# Patient Record
Sex: Female | Born: 1990 | Hispanic: No | Marital: Married | Smoking: Never smoker
Health system: Southern US, Community
[De-identification: ages and names within clinical notes are randomized; demographics above are authoritative.]

## PROBLEM LIST (undated history)

## (undated) DIAGNOSIS — G43909 Migraine, unspecified, not intractable, without status migrainosus: Secondary | ICD-10-CM

## (undated) HISTORY — PX: OTHER SURGICAL HISTORY: SHX169

---

## 2009-06-19 ENCOUNTER — Ambulatory Visit (HOSPITAL_COMMUNITY): Admission: RE | Admit: 2009-06-19 | Discharge: 2009-06-19 | Payer: Self-pay | Admitting: Obstetrics and Gynecology

## 2009-08-04 ENCOUNTER — Ambulatory Visit (HOSPITAL_COMMUNITY): Admission: RE | Admit: 2009-08-04 | Discharge: 2009-08-04 | Payer: Self-pay | Admitting: Obstetrics and Gynecology

## 2009-09-11 ENCOUNTER — Ambulatory Visit (HOSPITAL_COMMUNITY): Admission: RE | Admit: 2009-09-11 | Discharge: 2009-09-11 | Payer: Self-pay | Admitting: Obstetrics and Gynecology

## 2013-08-12 ENCOUNTER — Ambulatory Visit (INDEPENDENT_AMBULATORY_CARE_PROVIDER_SITE_OTHER): Payer: BC Managed Care – PPO | Admitting: Family Medicine

## 2013-08-12 ENCOUNTER — Encounter: Payer: Self-pay | Admitting: Family Medicine

## 2013-08-12 ENCOUNTER — Telehealth: Payer: Self-pay | Admitting: Family Medicine

## 2013-08-12 ENCOUNTER — Encounter (INDEPENDENT_AMBULATORY_CARE_PROVIDER_SITE_OTHER): Payer: Self-pay

## 2013-08-12 ENCOUNTER — Encounter: Payer: Self-pay | Admitting: *Deleted

## 2013-08-12 ENCOUNTER — Other Ambulatory Visit: Payer: Self-pay | Admitting: Family Medicine

## 2013-08-12 VITALS — BP 110/66 | HR 88 | Temp 98.1°F | Ht 64.0 in | Wt 150.6 lb

## 2013-08-12 DIAGNOSIS — H9190 Unspecified hearing loss, unspecified ear: Secondary | ICD-10-CM

## 2013-08-12 DIAGNOSIS — F32A Depression, unspecified: Secondary | ICD-10-CM

## 2013-08-12 DIAGNOSIS — F329 Major depressive disorder, single episode, unspecified: Secondary | ICD-10-CM

## 2013-08-12 DIAGNOSIS — R5383 Other fatigue: Secondary | ICD-10-CM

## 2013-08-12 DIAGNOSIS — F3289 Other specified depressive episodes: Secondary | ICD-10-CM

## 2013-08-12 DIAGNOSIS — H6690 Otitis media, unspecified, unspecified ear: Secondary | ICD-10-CM

## 2013-08-12 DIAGNOSIS — G4700 Insomnia, unspecified: Secondary | ICD-10-CM

## 2013-08-12 DIAGNOSIS — R5381 Other malaise: Secondary | ICD-10-CM

## 2013-08-12 LAB — POCT CBC
Granulocyte percent: 66.4 %G (ref 37–80)
HCT, POC: 42.8 % (ref 37.7–47.9)
Hemoglobin: 13.8 g/dL (ref 12.2–16.2)
Lymph, poc: 1.8 (ref 0.6–3.4)
MCH, POC: 29.5 pg (ref 27–31.2)
MCHC: 32.2 g/dL (ref 31.8–35.4)
MCV: 91.7 fL (ref 80–97)
MPV: 8 fL (ref 0–99.8)
POC Granulocyte: 3.8 (ref 2–6.9)
POC LYMPH PERCENT: 30.9 %L (ref 10–50)
Platelet Count, POC: 267 10*3/uL (ref 142–424)
RBC: 4.7 M/uL (ref 4.04–5.48)
RDW, POC: 12.9 %
WBC: 5.7 10*3/uL (ref 4.6–10.2)

## 2013-08-12 MED ORDER — SERTRALINE HCL 50 MG PO TABS: 50.0000 mg | ORAL_TABLET | Freq: Every day | ORAL | Status: AC

## 2013-08-12 MED ORDER — SERTRALINE HCL 50 MG PO TABS
50.0000 mg | ORAL_TABLET | Freq: Every day | ORAL | Status: DC
Start: 2013-08-12 — End: 2013-08-12

## 2013-08-12 MED ORDER — AMOXICILLIN 875 MG PO TABS: 875.0000 mg | ORAL_TABLET | Freq: Two times a day (BID) | ORAL | Status: DC

## 2013-08-12 NOTE — Progress Notes (Signed)
   Subjective:    Patient ID: Amber Byrd, female    DOB: 05/05/1991, 23 y.o.   MRN: [id number]  HPI  This 23 y.o. female presents for evaluation of ear problems.  She has been having some depression And insomnia.  She has been having some hearing problems.  She has hx of ear problems and  Perforations in the past. Review of Systems No chest pain, SOB, HA, dizziness, vision change, N/V, diarrhea, constipation, dysuria, urinary urgency or frequency, myalgias, arthralgias or rash.     Objective:   Physical Exam  Vital signs noted  Well developed well nourished female.  HEENT - Head atraumatic Normocephalic                Eyes - PERRLA, Conjuctiva - clear Sclera- Clear EOMI                Ears - EAC's Wnl TM's injected Gross Hearing WNL                Nose - Nares patent                 Throat - oropharanx wnl Respiratory - Lungs CTA bilateral Cardiac - RRR S1 and S2 without murmur GI - Abdomen soft Nontender and bowel sounds active x 4 Extremities - No edema. Neuro - Grossly intact.      Assessment & Plan:  Depression - Plan: sertraline (ZOLOFT) 50 MG tablet  Other malaise and fatigue - Plan: POCT CBC, CMP14+EGFR, Thyroid Panel With TSH, Vitamin B12, Vit D  25 hydroxy (rtn osteoporosis monitoring), Lipid panel  Otitis media - Plan: POCT CBC, CMP14+EGFR, Thyroid Panel With TSH, Vitamin B12, Vit D  25 hydroxy (rtn osteoporosis monitoring), Lipid panel, Ambulatory referral to ENT, amoxicillin (AMOXIL) 875 MG tablet  Hearing deficit - Plan: POCT CBC, CMP14+EGFR, Thyroid Panel With TSH, Vitamin B12, Vit D  25 hydroxy (rtn osteoporosis monitoring), Lipid panel, Ambulatory referral to ENT, amoxicillin (AMOXIL) 875 MG tablet  Insomnia - Plan: sertraline (ZOLOFT) 50 MG po qd.  List of psychiatric providers given to patient to get checked for possible bipolar disorder.  Discussed sleep hygiene and melatonin otc prn.  Lysbeth Penner FNP

## 2013-08-12 NOTE — Patient Instructions (Signed)
Otitis Media, Adult Otitis media is redness, soreness, and swelling (inflammation) of the middle ear. Otitis media may be caused by allergies or, most commonly, by infection. Often it occurs as a complication of the common cold. SIGNS AND SYMPTOMS Symptoms of otitis media may include:  Earache.  Fever.  Ringing in your ear.  Headache.  Leakage of fluid from the ear. DIAGNOSIS To diagnose otitis media, your health care provider will examine your ear with an otoscope. This is an instrument that allows your health care provider to see into your ear in order to examine your eardrum. Your health care provider also will ask you questions about your symptoms. TREATMENT  Typically, otitis media resolves on its own within 3 5 days. Your health care provider may prescribe medicine to ease your symptoms of pain. If otitis media does not resolve within 5 days or is recurrent, your health care provider may prescribe antibiotic medicines if he or she suspects that a bacterial infection is the cause. HOME CARE INSTRUCTIONS   Take your medicine as directed until it is gone, even if you feel better after the first few days.  Only take over-the-counter or prescription medicines for pain, discomfort, or fever as directed by your health care provider.  Follow up with your health care provider as directed. SEEK MEDICAL CARE IF:  You have otitis media only in one ear or bleeding from your nose or both.  You notice a lump on your neck.  You are not getting better in 3 5 days.  You feel worse instead of better. SEEK IMMEDIATE MEDICAL CARE IF:   You have pain that is not controlled with medicine.  You have swelling, redness, or pain around your ear or stiffness in your neck.  You notice that part of your face is paralyzed.  You notice that the bone behind your ear (mastoid) is tender when you touch it. MAKE SURE YOU:   Understand these instructions.  Will watch your condition.  Will get help  right away if you are not doing well or get worse. Document Released: 03/04/2004 Document Revised: 03/20/2013 Document Reviewed: 12/25/2012 Exit[employer] Patient Information 2014 [employer].  

## 2013-08-12 NOTE — Telephone Encounter (Signed)
Patient aware.

## 2013-08-13 ENCOUNTER — Other Ambulatory Visit: Payer: Self-pay | Admitting: Family Medicine

## 2013-08-13 LAB — CMP14+EGFR
ALT: 31 IU/L (ref 0–32)
AST: 27 IU/L (ref 0–40)
Albumin/Globulin Ratio: 1.6 (ref 1.1–2.5)
Albumin: 4.6 g/dL (ref 3.5–5.5)
Alkaline Phosphatase: 71 IU/L (ref 39–117)
BUN/Creatinine Ratio: 12 (ref 8–20)
BUN: 8 mg/dL (ref 6–20)
CO2: 23 mmol/L (ref 18–29)
Calcium: 10 mg/dL (ref 8.7–10.2)
Chloride: 100 mmol/L (ref 97–108)
Creatinine, Ser: 0.67 mg/dL (ref 0.57–1.00)
GFR calc Af Amer: 142 mL/min/{1.73_m2} (ref >59)
GFR calc non Af Amer: 123 mL/min/{1.73_m2} (ref >59)
Globulin, Total: 2.8 g/dL (ref 1.5–4.5)
Glucose: 93 mg/dL (ref 65–99)
Potassium: 4.8 mmol/L (ref 3.5–5.2)
Sodium: 140 mmol/L (ref 134–144)
Total Bilirubin: 0.4 mg/dL (ref 0.0–1.2)
Total Protein: 7.4 g/dL (ref 6.0–8.5)

## 2013-08-13 LAB — THYROID PANEL WITH TSH
Free Thyroxine Index: 2.2 (ref 1.2–4.9)
T3 Uptake Ratio: 28 % (ref 24–39)
T4, Total: 8 ug/dL (ref 4.5–12.0)
TSH: 3.2 u[IU]/mL (ref 0.450–4.500)

## 2013-08-13 LAB — VITAMIN D 25 HYDROXY (VIT D DEFICIENCY, FRACTURES): Vit D, 25-Hydroxy: 26.1 ng/mL — ABNORMAL LOW (ref 30.0–100.0)

## 2013-08-13 LAB — LIPID PANEL
Chol/HDL Ratio: 3.4 ratio units (ref 0.0–4.4)
Cholesterol, Total: 148 mg/dL (ref 100–189)
HDL: 43 mg/dL (ref >39)
LDL Calculated: 92 mg/dL (ref 0–119)
Triglycerides: 66 mg/dL (ref 0–114)
VLDL Cholesterol Cal: 13 mg/dL (ref 5–40)

## 2013-08-13 LAB — VITAMIN B12: Vitamin B-12: 379 pg/mL (ref 211–946)

## 2013-08-13 MED ORDER — VITAMIN D (ERGOCALCIFEROL) 1.25 MG (50000 UNIT) PO CAPS: 50000.0000 [IU] | ORAL_CAPSULE | ORAL | Status: AC

## 2013-09-05 ENCOUNTER — Encounter: Payer: Self-pay | Admitting: *Deleted

## 2014-07-09 ENCOUNTER — Other Ambulatory Visit: Payer: Self-pay | Admitting: Obstetrics & Gynecology

## 2014-07-09 DIAGNOSIS — O3680X0 Pregnancy with inconclusive fetal viability, not applicable or unspecified: Secondary | ICD-10-CM

## 2014-07-11 ENCOUNTER — Emergency Department (HOSPITAL_COMMUNITY): Payer: Medicaid Other

## 2014-07-11 ENCOUNTER — Ambulatory Visit: Payer: Self-pay

## 2014-07-11 ENCOUNTER — Encounter (HOSPITAL_COMMUNITY): Payer: Self-pay

## 2014-07-11 ENCOUNTER — Emergency Department (HOSPITAL_COMMUNITY)
Admission: EM | Admit: 2014-07-11 | Discharge: 2014-07-11 | Disposition: A | Discharge status: Home / Self Care | Payer: Medicaid Other | Attending: Emergency Medicine | Admitting: Emergency Medicine

## 2014-07-11 DIAGNOSIS — O209 Hemorrhage in early pregnancy, unspecified: Secondary | ICD-10-CM | POA: Insufficient documentation

## 2014-07-11 DIAGNOSIS — Z79899 Other long term (current) drug therapy: Secondary | ICD-10-CM | POA: Insufficient documentation

## 2014-07-11 DIAGNOSIS — Z3A01 Less than 8 weeks gestation of pregnancy: Secondary | ICD-10-CM | POA: Diagnosis not present

## 2014-07-11 DIAGNOSIS — Z792 Long term (current) use of antibiotics: Secondary | ICD-10-CM | POA: Diagnosis not present

## 2014-07-11 DIAGNOSIS — O00109 Unspecified tubal pregnancy without intrauterine pregnancy: Secondary | ICD-10-CM

## 2014-07-11 LAB — CBC WITH DIFFERENTIAL/PLATELET
Basophils Absolute: 0.1 10*3/uL (ref 0.0–0.1)
Basophils Relative: 1 % (ref 0–1)
EOS PCT: 6 % — AB (ref 0–5)
Eosinophils Absolute: 0.4 10*3/uL (ref 0.0–0.7)
HEMATOCRIT: 36.8 % (ref 36.0–46.0)
HEMOGLOBIN: 12.7 g/dL (ref 12.0–15.0)
LYMPHS ABS: 1.4 10*3/uL (ref 0.7–4.0)
LYMPHS PCT: 24 % (ref 12–46)
MCH: 31.3 pg (ref 26.0–34.0)
MCHC: 34.5 g/dL (ref 30.0–36.0)
MCV: 90.6 fL (ref 78.0–100.0)
MONOS PCT: 5 % (ref 3–12)
Monocytes Absolute: 0.3 10*3/uL (ref 0.1–1.0)
NEUTROS ABS: 3.8 10*3/uL (ref 1.7–7.7)
Neutrophils Relative %: 64 % (ref 43–77)
PLATELETS: 222 10*3/uL (ref 150–400)
RBC: 4.06 MIL/uL (ref 3.87–5.11)
RDW: 13.1 % (ref 11.5–15.5)
WBC: 5.9 10*3/uL (ref 4.0–10.5)

## 2014-07-11 LAB — URINALYSIS, ROUTINE W REFLEX MICROSCOPIC
Bilirubin Urine: NEGATIVE
Glucose, UA: NEGATIVE mg/dL
HGB URINE DIPSTICK: NEGATIVE
KETONES UR: NEGATIVE mg/dL
Leukocytes, UA: NEGATIVE
Nitrite: NEGATIVE
PH: 6 (ref 5.0–8.0)
Protein, ur: NEGATIVE mg/dL
Specific Gravity, Urine: >1.03 — ABNORMAL HIGH (ref 1.005–1.030)
UROBILINOGEN UA: 0.2 mg/dL (ref 0.0–1.0)

## 2014-07-11 LAB — HEPATIC FUNCTION PANEL
ALBUMIN: 3.9 g/dL (ref 3.5–5.2)
ALT: 14 U/L (ref 0–35)
AST: 15 U/L (ref 0–37)
Alkaline Phosphatase: 55 U/L (ref 39–117)
BILIRUBIN INDIRECT: 0.4 mg/dL (ref 0.3–0.9)
Bilirubin, Direct: 0.1 mg/dL (ref 0.0–0.5)
Total Bilirubin: 0.5 mg/dL (ref 0.3–1.2)
Total Protein: 7.1 g/dL (ref 6.0–8.3)

## 2014-07-11 LAB — ABO/RH: ABO/RH(D): A NEG

## 2014-07-11 LAB — WET PREP, GENITAL
Trich, Wet Prep: NONE SEEN
WBC WET PREP: NONE SEEN
YEAST WET PREP: NONE SEEN

## 2014-07-11 LAB — POC URINE PREG, ED: PREG TEST UR: POSITIVE — AB

## 2014-07-11 LAB — HCG, QUANTITATIVE, PREGNANCY: hCG, Beta Chain, Quant, S: 11942 m[IU]/mL — ABNORMAL HIGH (ref <5)

## 2014-07-11 MED ORDER — RHO D IMMUNE GLOBULIN 1500 UNIT/2ML IJ SOSY
300.0000 ug | PREFILLED_SYRINGE | Freq: Once | INTRAMUSCULAR | Status: AC
  Administered 2014-07-11: 300 ug via INTRAMUSCULAR

## 2014-07-11 MED ORDER — METHOTREXATE INJECTION FOR WOMEN'S HOSPITAL
64.0000 mg | Freq: Once | INTRAMUSCULAR | Status: DC
  Filled 2014-07-11: qty 1.3

## 2014-07-11 MED ORDER — METHOTREXATE INJECTION FOR WOMEN'S HOSPITAL
50.0000 mg/m2 | Freq: Once | INTRAMUSCULAR | Status: AC
  Administered 2014-07-11: 85 mg via INTRAMUSCULAR
  Filled 2014-07-11: qty 1.7

## 2014-07-11 NOTE — ED Provider Notes (Signed)
CSN: [id number]     Arrival date & time 07/11/14  4098 History   First MD Initiated Contact with Patient 07/11/14 [phone]     Chief Complaint  Patient presents with  . Vaginal Bleeding     (Consider location/radiation/quality/duration/timing/severity/associated sxs/prior Treatment) Patient is a 24 y.o. female presenting with vaginal bleeding. The history is provided by the patient.  Vaginal Bleeding Quality:  Bright red Severity:  Moderate Onset quality:  Sudden Timing:  Intermittent Progression:  Worsening Chronicity:  New Menstrual history:  Irregular Possible pregnancy: yes   Relieved by:  None tried Worsened by:  Nothing tried Ineffective treatments:  None tried Associated symptoms: no dysuria and no vaginal discharge    Amber Byrd is a 24 y.o. G3P2 who presents to the ED with vaginal bleeding in early pregnancy. She states that she went to [facility] Jan. 12 for an ear infection and they told her at that visit that she was pregnant. She states that her periods are irregular since removal of her IUD almost a year ago and she did not know she was pregnant. She uses no birth control. The bleeding started one week ago as spotting and has gotten heavier over the past 2 days using about 4 pads per day. She denies abdominal cramping.  Patient does desire to be pregnant.   History reviewed. No pertinent past medical history. Past Surgical History  Procedure Laterality Date  . 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 Diabetes Mother   . Heart disease Mother   . Depression Father   . Arthritis Father    History  Substance Use Topics  . Smoking status: Never Smoker   . Smokeless tobacco: Not on file  . Alcohol Use: No   OB History    No data available     Review of Systems  Genitourinary: Positive for vaginal bleeding. Negative for dysuria, vaginal discharge and pelvic pain.       Pregnant  all other systems  negative    Allergies  Review of patient's allergies indicates no known allergies.  Home Medications   Prior to Admission medications   Medication Sig Start Date End Date Taking? Authorizing Provider  amoxicillin (AMOXIL) 875 MG tablet Take 1 tablet (875 mg total) by mouth 2 (two) times daily. Patient not taking: Reported on 07/11/2014 08/12/13   Deatra Canter, FNP  sertraline (ZOLOFT) 50 MG tablet Take 1 tablet (50 mg total) by mouth daily. Patient not taking: Reported on 07/11/2014 08/12/13   Deatra Canter, FNP  Vitamin D, Ergocalciferol, (DRISDOL) 50000 UNITS CAPS capsule Take 1 capsule (50,000 Units total) by mouth every 7 (seven) days. Patient not taking: Reported on 07/11/2014 08/13/13   Deatra Canter, FNP   BP 123/67 mmHg  Pulse 62  Temp(Src) 98 F (36.7 C) (Oral)  Resp 18  Ht 5\' 3"  (1.6 m)  Wt 140 lb (63.504 kg)  BMI 24.81 kg/m2  SpO2 100%  LMP 04/29/2014 Physical Exam  Constitutional: She is oriented to person, place, and time. She appears well-developed and well-nourished.  HENT:  Head: Normocephalic.  Eyes: Conjunctivae and EOM are normal.  Neck: Neck supple.  Cardiovascular: Normal rate and regular rhythm.   Pulmonary/Chest: Effort normal. She has no wheezes. She has no rales.  Abdominal: Soft. There is no tenderness.  Genitourinary:  External genitalia without lesions, small blood vaginal vault. Cervix closed, inflamed, no CMT, no adnexal  tenderness or mass palpated. Uterus slightly enlarged.   Musculoskeletal: Normal range of motion.  Neurological: She is alert and oriented to person, place, and time. No cranial nerve deficit.  Skin: Skin is warm and dry.  Psychiatric: She has a normal mood and affect. Her behavior is normal.  Nursing note and vitals reviewed.   ED Course  Procedures (including critical care time) Labs, ultrasound Labs Review Results for orders placed or performed during the hospital encounter of 07/11/14 (from the past 24 hour(s))   Urinalysis, Routine w reflex microscopic     Status: Abnormal   Collection Time: 07/11/14  9:43 AM  Result Value Ref Range   Color, Urine YELLOW YELLOW   APPearance CLEAR CLEAR   Specific Gravity, Urine >1.030 (H) 1.005 - 1.030   pH 6.0 5.0 - 8.0   Glucose, UA NEGATIVE NEGATIVE mg/dL   Hgb urine dipstick NEGATIVE NEGATIVE   Bilirubin Urine NEGATIVE NEGATIVE   Ketones, ur NEGATIVE NEGATIVE mg/dL   Protein, ur NEGATIVE NEGATIVE mg/dL   Urobilinogen, UA 0.2 0.0 - 1.0 mg/dL   Nitrite NEGATIVE NEGATIVE   Leukocytes, UA NEGATIVE NEGATIVE  Hepatic function panel     Status: None   Collection Time: 07/11/14  9:51 AM  Result Value Ref Range   Total Protein 7.1 6.0 - 8.3 g/dL   Albumin 3.9 3.5 - 5.2 g/dL   AST 15 0 - 37 U/L   ALT 14 0 - 35 U/L   Alkaline Phosphatase 55 39 - 117 U/L   Total Bilirubin 0.5 0.3 - 1.2 mg/dL   Bilirubin, Direct 0.1 0.0 - 0.5 mg/dL   Indirect Bilirubin 0.4 0.3 - 0.9 mg/dL  POC urine preg, ED (not at [facility])     Status: Abnormal   Collection Time: 07/11/14  9:56 AM  Result Value Ref Range   Preg Test, Ur POSITIVE (A) NEGATIVE  hCG, quantitative, pregnancy     Status: Abnormal   Collection Time: 07/11/14  9:59 AM  Result Value Ref Range   hCG, Beta Chain, Quant, S 11942 (H) <5 mIU/mL  CBC with Differential/Platelet     Status: Abnormal   Collection Time: 07/11/14  9:59 AM  Result Value Ref Range   WBC 5.9 4.0 - 10.5 K/uL   RBC 4.06 3.87 - 5.11 MIL/uL   Hemoglobin 12.7 12.0 - 15.0 g/dL   HCT 16.1 09.6 - 04.5 %   MCV 90.6 78.0 - 100.0 fL   MCH 31.3 26.0 - 34.0 pg   MCHC 34.5 30.0 - 36.0 g/dL   RDW 40.9 81.1 - 91.4 %   Platelets 222 150 - 400 K/uL   Neutrophils Relative % 64 43 - 77 %   Neutro Abs 3.8 1.7 - 7.7 K/uL   Lymphocytes Relative 24 12 - 46 %   Lymphs Abs 1.4 0.7 - 4.0 K/uL   Monocytes Relative 5 3 - 12 %   Monocytes Absolute 0.3 0.1 - 1.0 K/uL   Eosinophils Relative 6 (H) 0 - 5 %   Eosinophils Absolute 0.4 0.0 - 0.7 K/uL   Basophils Relative  1 0 - 1 %   Basophils Absolute 0.1 0.0 - 0.1 K/uL  ABO/Rh     Status: None   Collection Time: 07/11/14  9:59 AM  Result Value Ref Range   ABO/RH(D) A NEG   Wet prep, genital     Status: Abnormal   Collection Time: 07/11/14 10:15 AM  Result Value Ref Range   Yeast Wet Prep HPF POC  NONE SEEN NONE SEEN   Trich, Wet Prep NONE SEEN NONE SEEN   Clue Cells Wet Prep HPF POC MANY (A) NONE SEEN   WBC, Wet Prep HPF POC NONE SEEN NONE SEEN    Imaging Review [place] Ob Comp Less 14 Wks  07/11/2014   CLINICAL DATA:  Vaginal bleeding, early pregnancy, quantitative beta HCG 11,942  EXAM: OBSTETRIC <14 WK US AND TRANSVAGINAL OB US  TECHNIQUE: Both transabdominal and transvaginal ultrasound examinations were performed for complete evaluation of the gestation as well as the maternal uterus, adnexal regions, and pelvic cul-de-sac. Transvaginal technique was performed to assess early pregnancy.  COMPARISON:  None for this pregnancy  FINDINGS: Intrauterine gestational sac: Not identified  Yolk sac:  N/A  Embryo:  N/A  Cardiac Activity: N/A  Heart Rate:  N/A bpm  Maternal uterus/adnexae:  Minimal amount of linear nonspecific hypoechoic material within the endometrial canal likely minimal fluid.  Nabothian cyst at cervix.  No discrete gestational sac identified.  LEFT ovary normal size and morphology 2.9 x 1.4 x 2.9 cm.  RIGHT ovary normal size and morphology, 3.0 x 2.3 x 3.1 cm.  Immediately adjacent to the RIGHT ovary is a heterogeneous rounded nodule measuring 2.1 x 1.7 x 2.2 cm in size.  Lesion shows a small central hypoechoic focus question fluid.  Peripheral hypervascularity within lesion on color Doppler imaging.  Finding is consistent with an ectopic pregnancy adjacent to the RIGHT ovary.  No additional pelvic masses or free pelvic fluid.  Prominent parametrial vessels incidentally noted.  IMPRESSION: No intrauterine gestational identified.  RIGHT adnexal ectopic pregnancy adjacent to the RIGHT ovary 2.1 x 1.7 x 2.2 cm  in size.  Critical Value/emergent results were called by telephone at the time of interpretation on 07/11/2014 at 12:13 pm to Dr. Hyacinth MeekerMiller who verbally acknowledged these results.   Electronically Signed   By: Ulyses SouthwardMark  Boles M.D.   On: 07/11/2014 12:14   [place] Ob Transvaginal  07/11/2014   CLINICAL DATA:  Vaginal bleeding, early pregnancy, quantitative beta HCG 11,942  EXAM: OBSTETRIC <14 WK US AND TRANSVAGINAL OB US  TECHNIQUE: Both transabdominal and transvaginal ultrasound examinations were performed for complete evaluation of the gestation as well as the maternal uterus, adnexal regions, and pelvic cul-de-sac. Transvaginal technique was performed to assess early pregnancy.  COMPARISON:  None for this pregnancy  FINDINGS: Intrauterine gestational sac: Not identified  Yolk sac:  N/A  Embryo:  N/A  Cardiac Activity: N/A  Heart Rate:  N/A bpm  Maternal uterus/adnexae:  Minimal amount of linear nonspecific hypoechoic material within the endometrial canal likely minimal fluid.  Nabothian cyst at cervix.  No discrete gestational sac identified.  LEFT ovary normal size and morphology 2.9 x 1.4 x 2.9 cm.  RIGHT ovary normal size and morphology, 3.0 x 2.3 x 3.1 cm.  Immediately adjacent to the RIGHT ovary is a heterogeneous rounded nodule measuring 2.1 x 1.7 x 2.2 cm in size.  Lesion shows a small central hypoechoic focus question fluid.  Peripheral hypervascularity within lesion on color Doppler imaging.  Finding is consistent with an ectopic pregnancy adjacent to the RIGHT ovary.  No additional pelvic masses or free pelvic fluid.  Prominent parametrial vessels incidentally noted.  IMPRESSION: No intrauterine gestational identified.  RIGHT adnexal ectopic pregnancy adjacent to the RIGHT ovary 2.1 x 1.7 x 2.2 cm in size.  Critical Value/emergent results were called by telephone at the time of interpretation on 07/11/2014 at 12:13 pm to Dr. Hyacinth MeekerMiller who verbally acknowledged these  results.   Electronically Signed   By: Ulyses Southward  M.D.   On: 07/11/2014 12:14   US Ob Comp Less 14 Wks  07/11/2014   CLINICAL DATA:  Vaginal bleeding, early pregnancy, quantitative beta HCG 11,942  EXAM: OBSTETRIC <14 WK [place] AND TRANSVAGINAL OB US  TECHNIQUE: Both transabdominal and transvaginal ultrasound examinations were performed for complete evaluation of the gestation as well as the maternal uterus, adnexal regions, and pelvic cul-de-sac. Transvaginal technique was performed to assess early pregnancy.  COMPARISON:  None for this pregnancy  FINDINGS: Intrauterine gestational sac: Not identified  Yolk sac:  N/A  Embryo:  N/A  Cardiac Activity: N/A  Heart Rate:  N/A bpm  Maternal uterus/adnexae:  Minimal amount of linear nonspecific hypoechoic material within the endometrial canal likely minimal fluid.  Nabothian cyst at cervix.  No discrete gestational sac identified.  LEFT ovary normal size and morphology 2.9 x 1.4 x 2.9 cm.  RIGHT ovary normal size and morphology, 3.0 x 2.3 x 3.1 cm.  Immediately adjacent to the RIGHT ovary is a heterogeneous rounded nodule measuring 2.1 x 1.7 x 2.2 cm in size.  Lesion shows a small central hypoechoic focus question fluid.  Peripheral hypervascularity within lesion on color Doppler imaging.  Finding is consistent with an ectopic pregnancy adjacent to the RIGHT ovary.  No additional pelvic masses or free pelvic fluid.  Prominent parametrial vessels incidentally noted.  IMPRESSION: No intrauterine gestational identified.  RIGHT adnexal ectopic pregnancy adjacent to the RIGHT ovary 2.1 x 1.7 x 2.2 cm in size.  Critical Value/emergent results were called by telephone at the time of interpretation on 07/11/2014 at 12:13 pm to Dr. Hyacinth Meeker who verbally acknowledged these results.   Electronically Signed   By: Ulyses Southward M.D.   On: 07/11/2014 12:14   US Ob Transvaginal  07/11/2014   CLINICAL DATA:  Vaginal bleeding, early pregnancy, quantitative beta HCG 11,942  EXAM: OBSTETRIC <14 WK [place] AND TRANSVAGINAL OB US  TECHNIQUE: Both  transabdominal and transvaginal ultrasound examinations were performed for complete evaluation of the gestation as well as the maternal uterus, adnexal regions, and pelvic cul-de-sac. Transvaginal technique was performed to assess early pregnancy.  COMPARISON:  None for this pregnancy  FINDINGS: Intrauterine gestational sac: Not identified  Yolk sac:  N/A  Embryo:  N/A  Cardiac Activity: N/A  Heart Rate:  N/A bpm  Maternal uterus/adnexae:  Minimal amount of linear nonspecific hypoechoic material within the endometrial canal likely minimal fluid.  Nabothian cyst at cervix.  No discrete gestational sac identified.  LEFT ovary normal size and morphology 2.9 x 1.4 x 2.9 cm.  RIGHT ovary normal size and morphology, 3.0 x 2.3 x 3.1 cm.  Immediately adjacent to the RIGHT ovary is a heterogeneous rounded nodule measuring 2.1 x 1.7 x 2.2 cm in size.  Lesion shows a small central hypoechoic focus question fluid.  Peripheral hypervascularity within lesion on color Doppler imaging.  Finding is consistent with an ectopic pregnancy adjacent to the RIGHT ovary.  No additional pelvic masses or free pelvic fluid.  Prominent parametrial vessels incidentally noted.  IMPRESSION: No intrauterine gestational identified.  RIGHT adnexal ectopic pregnancy adjacent to the RIGHT ovary 2.1 x 1.7 x 2.2 cm in size.  Critical Value/emergent results were called by telephone at the time of interpretation on 07/11/2014 at 12:13 pm to Dr. Hyacinth Meeker who verbally acknowledged these results.   Electronically Signed   By: Ulyses Southward M.D.   On: 07/11/2014 12:14   Consult with  Dr. Despina Hidden, will give MTX for ectopic pregnancy and she will follow up in the office on Tuesday 2/2/ to repeat the Bhcg. Strict ectopic precautions. No sex, no tampons, nothing in the vagina until ectopic resolves. Patient to go to [facility] for severe pain, heavy bleeding or other problems.   MDM  24 y.o. G3P2 with bleeding in early pregnancy. Evaluated by me an Dr. Hyacinth Meeker. Treated  for ectopic pregnancy with MTX, Rhogam given due to patient blood type A negative. I have reviewed this patient's vital signs, nurses notes, appropriate labs and imaging.  I have discussed findings and plan of care in detail with the patient and she voices understanding and agrees with plan.   Final diagnoses:  Prior ectopic pregnancy in first trimester, antepartum      [facility], NP 07/12/14 [id number]  Vida Roller, MD 07/12/14 [phone]

## 2014-07-11 NOTE — ED Notes (Signed)
Pt reports lmp was November  17 and had a positive pregnancy test Jan 12.  [facility] and was told she was pregnant.  Pt says her periods have been irregular since merena had been removed last March.   Pt reports has had vaginal bleeding x 1 week.  Says doesn't have insurance so she is having a hard time getting  in to see any obgyn.  Denies pain except for cramping after eating.  Denies n/v.  Reports bleeding has been as heavy as her usual periods.

## 2014-07-11 NOTE — ED Notes (Signed)
Blood bank called for rhogam medication and they will have to further testing. Blood bank to call when medication available.

## 2014-07-11 NOTE — ED Provider Notes (Addendum)
This chart was scribed for No att. providers found by Gwenyth Oberatherine Macek, ED Scribe. This patient was seen in room APA01/APA01 and the patient's care was started at 12:14 PM.   HPI Comments: 24 y/o G3P2 . pregnant female who presents to the Emergency Department complaining of gradually worsening vaginal spotting that started 1 week ago. Pt states pain after eating as an associated symptom. She does not know how far along she is, but states her LNMP was 04/2014. Pt reports that she was on birth control until 08/2013. This is pt's 3rd pregnancy. Both prior pregnancies were born to term via vaginal delivery. Pt denies any problems with prior pregnancies. She also denies a history of ovarian or uterine issues. Pt reports that she had an abnormal pap smear last year, but did not follow-up because she lacks insurance. She denies current pain.  Physical Exam: Exam benign which included HR, lungs and abdomen which is non-tender. Reference Nurse Practioner's exam for pelvic exam.  Coordination of Care 12:17 PM Informed pt of results. Pending Gyn consult  Discussed with OB/GYN, Dr. Despina HiddenEure, recommends intramuscular methotrexate and follow-up on Tuesday. Patient informed of plan, stable. For discharge, no abdominal tenderness, no hypotension, no tachycardia, no evidence of hemorrhage on ultrasound.  Medical screening examination/treatment/procedure(s) were conducted as a shared visit with non-physician practitioner(s) and myself.  I personally evaluated the patient during the encounter.  Clinical Impression:   Final diagnoses:  Vaginal bleeding before [redacted] weeks gestation         I personally performed the services described in this documentation, which was scribed in my presence. The recorded information has been reviewed and is accurate.    Vida RollerBrian D Melodie Ashworth, MD 07/11/14 1228  Vida RollerBrian D Lavra Imler, MD 07/11/14 1239

## 2014-07-11 NOTE — ED Notes (Signed)
Pharm[facility] called [facility].

## 2014-07-12 LAB — RH IG WORKUP (INCLUDES ABO/RH)
ABO/RH(D): A NEG
Antibody Screen: NEGATIVE
Gestational Age(Wks): 6
Unit division: 0

## 2014-07-14 LAB — GC/CHLAMYDIA PROBE AMP (~~LOC~~) NOT AT ARMC
Chlamydia: NEGATIVE
NEISSERIA GONORRHEA: NEGATIVE

## 2014-07-14 LAB — HIV ANTIBODY (ROUTINE TESTING W REFLEX): HIV Screen 4th Generation wRfx: NONREACTIVE

## 2014-07-14 LAB — RPR: RPR Ser Ql: NONREACTIVE

## 2014-07-15 ENCOUNTER — Other Ambulatory Visit: Payer: Self-pay

## 2014-07-16 ENCOUNTER — Ambulatory Visit: Payer: Self-pay | Admitting: Obstetrics and Gynecology

## 2014-10-06 ENCOUNTER — Encounter (HOSPITAL_COMMUNITY): Payer: Self-pay | Admitting: Emergency Medicine

## 2014-10-06 ENCOUNTER — Emergency Department (HOSPITAL_COMMUNITY): Payer: Medicaid Other

## 2014-10-06 ENCOUNTER — Emergency Department (HOSPITAL_COMMUNITY)
Admission: EM | Admit: 2014-10-06 | Discharge: 2014-10-06 | Disposition: A | Discharge status: Home / Self Care | Payer: Medicaid Other | Attending: Emergency Medicine | Admitting: Emergency Medicine

## 2014-10-06 DIAGNOSIS — J309 Allergic rhinitis, unspecified: Secondary | ICD-10-CM | POA: Insufficient documentation

## 2014-10-06 DIAGNOSIS — Z3202 Encounter for pregnancy test, result negative: Secondary | ICD-10-CM | POA: Insufficient documentation

## 2014-10-06 DIAGNOSIS — Z79899 Other long term (current) drug therapy: Secondary | ICD-10-CM | POA: Diagnosis not present

## 2014-10-06 DIAGNOSIS — R1031 Right lower quadrant pain: Secondary | ICD-10-CM | POA: Diagnosis present

## 2014-10-06 DIAGNOSIS — R630 Anorexia: Secondary | ICD-10-CM | POA: Insufficient documentation

## 2014-10-06 DIAGNOSIS — Z792 Long term (current) use of antibiotics: Secondary | ICD-10-CM | POA: Diagnosis not present

## 2014-10-06 DIAGNOSIS — N83201 Unspecified ovarian cyst, right side: Secondary | ICD-10-CM

## 2014-10-06 DIAGNOSIS — N8320 Unspecified ovarian cysts: Secondary | ICD-10-CM | POA: Diagnosis not present

## 2014-10-06 LAB — COMPREHENSIVE METABOLIC PANEL
ALBUMIN: 4.1 g/dL (ref 3.5–5.2)
ALK PHOS: 74 U/L (ref 39–117)
ALT: 25 U/L (ref 0–35)
AST: 25 U/L (ref 0–37)
Anion gap: 3 — ABNORMAL LOW (ref 5–15)
BUN: 12 mg/dL (ref 6–23)
CALCIUM: 8.7 mg/dL (ref 8.4–10.5)
CO2: 26 mmol/L (ref 19–32)
CREATININE: 0.73 mg/dL (ref 0.50–1.10)
Chloride: 103 mmol/L (ref 96–112)
GFR calc Af Amer: >90 mL/min (ref >90)
GFR calc non Af Amer: >90 mL/min (ref >90)
Glucose, Bld: 108 mg/dL — ABNORMAL HIGH (ref 70–99)
Potassium: 3.2 mmol/L — ABNORMAL LOW (ref 3.5–5.1)
Sodium: 132 mmol/L — ABNORMAL LOW (ref 135–145)
Total Bilirubin: 0.2 mg/dL — ABNORMAL LOW (ref 0.3–1.2)
Total Protein: 7.4 g/dL (ref 6.0–8.3)

## 2014-10-06 LAB — URINALYSIS, ROUTINE W REFLEX MICROSCOPIC
BILIRUBIN URINE: NEGATIVE
GLUCOSE, UA: NEGATIVE mg/dL
Hgb urine dipstick: NEGATIVE
Nitrite: NEGATIVE
Protein, ur: NEGATIVE mg/dL
Specific Gravity, Urine: 1.02 (ref 1.005–1.030)
UROBILINOGEN UA: 0.2 mg/dL (ref 0.0–1.0)
pH: 6 (ref 5.0–8.0)

## 2014-10-06 LAB — CBC WITH DIFFERENTIAL/PLATELET
Basophils Absolute: 0 10*3/uL (ref 0.0–0.1)
Basophils Relative: 0 % (ref 0–1)
EOS PCT: 5 % (ref 0–5)
Eosinophils Absolute: 0.3 10*3/uL (ref 0.0–0.7)
HEMATOCRIT: 37.2 % (ref 36.0–46.0)
Hemoglobin: 12.8 g/dL (ref 12.0–15.0)
LYMPHS ABS: 1.5 10*3/uL (ref 0.7–4.0)
Lymphocytes Relative: 27 % (ref 12–46)
MCH: 31.1 pg (ref 26.0–34.0)
MCHC: 34.4 g/dL (ref 30.0–36.0)
MCV: 90.5 fL (ref 78.0–100.0)
MONO ABS: 0.4 10*3/uL (ref 0.1–1.0)
Monocytes Relative: 7 % (ref 3–12)
Neutro Abs: 3.4 10*3/uL (ref 1.7–7.7)
Neutrophils Relative %: 61 % (ref 43–77)
Platelets: 244 10*3/uL (ref 150–400)
RBC: 4.11 MIL/uL (ref 3.87–5.11)
RDW: 11.9 % (ref 11.5–15.5)
WBC: 5.7 10*3/uL (ref 4.0–10.5)

## 2014-10-06 LAB — WET PREP, GENITAL
Trich, Wet Prep: NONE SEEN
Yeast Wet Prep HPF POC: NONE SEEN

## 2014-10-06 LAB — URINE MICROSCOPIC-ADD ON

## 2014-10-06 LAB — POC URINE PREG, ED: Preg Test, Ur: NEGATIVE

## 2014-10-06 MED ORDER — IOHEXOL 300 MG/ML  SOLN
100.0000 mL | Freq: Once | INTRAMUSCULAR | Status: AC | PRN
  Administered 2014-10-06: 100 mL via INTRAVENOUS

## 2014-10-06 MED ORDER — IOHEXOL 300 MG/ML  SOLN
25.0000 mL | Freq: Once | INTRAMUSCULAR | Status: AC | PRN
  Administered 2014-10-06: 25 mL via ORAL

## 2014-10-06 MED ORDER — SODIUM CHLORIDE 0.9 % IV SOLN
INTRAVENOUS | Status: DC
  Administered 2014-10-06: 19:00:00 via INTRAVENOUS

## 2014-10-06 MED ORDER — ONDANSETRON HCL 4 MG/2ML IJ SOLN
4.0000 mg | Freq: Once | INTRAMUSCULAR | Status: AC
  Administered 2014-10-06: 4 mg via INTRAVENOUS
  Filled 2014-10-06: qty 2

## 2014-10-06 MED ORDER — KETOROLAC TROMETHAMINE 30 MG/ML IJ SOLN
30.0000 mg | Freq: Once | INTRAMUSCULAR | Status: AC
  Administered 2014-10-06: 30 mg via INTRAVENOUS
  Filled 2014-10-06: qty 1

## 2014-10-06 MED ORDER — NAPROXEN SODIUM 550 MG PO TABS: 550.0000 mg | ORAL_TABLET | Freq: Two times a day (BID) | ORAL | Status: DC

## 2014-10-06 MED ORDER — HYDROCODONE-ACETAMINOPHEN 5-325 MG PO TABS: 1.0000 | ORAL_TABLET | ORAL | Status: DC | PRN

## 2014-10-06 NOTE — Discharge Instructions (Signed)
Your CT scan today show that your appendix is normal. You do have a cyst on the right ovary. Take the medication as directed for pain. Do not take the narcotic if driving as it will make you sleepy. Follow up with your GYN. Return here as needed.   Ovarian Cyst An ovarian cyst is a fluid-filled sac that forms on an ovary. The ovaries are small organs that produce eggs in women. Various types of cysts can form on the ovaries. Most are not cancerous. Many do not cause problems, and they often go away on their own. Some may cause symptoms and require treatment. Common types of ovarian cysts include:  Functional cysts--These cysts may occur every month during the menstrual cycle. This is normal. The cysts usually go away with the next menstrual cycle if the woman does not get pregnant. Usually, there are no symptoms with a functional cyst.  Endometrioma cysts--These cysts form from the tissue that lines the uterus. They are also called "chocolate cysts" because they become filled with blood that turns brown. This type of cyst can cause pain in the lower abdomen during intercourse and with your menstrual period.  Cystadenoma cysts--This type develops from the cells on the outside of the ovary. These cysts can get very big and cause lower abdomen pain and pain with intercourse. This type of cyst can twist on itself, cut off its blood supply, and cause severe pain. It can also easily rupture and cause a lot of pain.  Dermoid cysts--This type of cyst is sometimes found in both ovaries. These cysts may contain different kinds of body tissue, such as skin, teeth, hair, or cartilage. They usually do not cause symptoms unless they get very big.  Theca lutein cysts--These cysts occur when too much of a certain hormone (human chorionic gonadotropin) is produced and overstimulates the ovaries to produce an egg. This is most common after procedures used to assist with the conception of a baby (in vitro  fertilization). CAUSES   Fertility drugs can cause a condition in which multiple large cysts are formed on the ovaries. This is called ovarian hyperstimulation syndrome.  A condition called polycystic ovary syndrome can cause hormonal imbalances that can lead to nonfunctional ovarian cysts. SIGNS AND SYMPTOMS  Many ovarian cysts do not cause symptoms. If symptoms are present, they may include:  Pelvic pain or pressure.  Pain in the lower abdomen.  Pain during sexual intercourse.  Increasing girth (swelling) of the abdomen.  Abnormal menstrual periods.  Increasing pain with menstrual periods.  Stopping having menstrual periods without being pregnant. DIAGNOSIS  These cysts are commonly found during a routine or annual pelvic exam. Tests may be ordered to find out more about the cyst. These tests may include:  Ultrasound.  X-ray of the pelvis.  CT scan.  MRI.  Blood tests. TREATMENT  Many ovarian cysts go away on their own without treatment. Your health care provider may want to check your cyst regularly for 2-3 months to see if it changes. For women in menopause, it is particularly important to monitor a cyst closely because of the higher rate of ovarian cancer in menopausal women. When treatment is needed, it may include any of the following:  A procedure to drain the cyst (aspiration). This may be done using a long needle and ultrasound. It can also be done through a laparoscopic procedure. This involves using a thin, lighted tube with a tiny camera on the end (laparoscope) inserted through a small incision.  Surgery to remove the whole cyst. This may be done using laparoscopic surgery or an open surgery involving a larger incision in the lower abdomen.  Hormone treatment or birth control pills. These methods are sometimes used to help dissolve a cyst. HOME CARE INSTRUCTIONS   Only take over-the-counter or prescription medicines as directed by your health care  provider.  Follow up with your health care provider as directed.  Get regular pelvic exams and Pap tests. SEEK MEDICAL CARE IF:   Your periods are late, irregular, or painful, or they stop.  Your pelvic pain or abdominal pain does not go away.  Your abdomen becomes larger or swollen.  You have pressure on your bladder or trouble emptying your bladder completely.  You have pain during sexual intercourse.  You have feelings of fullness, pressure, or discomfort in your stomach.  You lose weight for no apparent reason.  You feel generally ill.  You become constipated.  You lose your appetite.  You develop acne.  You have an increase in body and facial hair.  You are gaining weight, without changing your exercise and eating habits.  You think you are pregnant. SEEK IMMEDIATE MEDICAL CARE IF:   You have increasing abdominal pain.  You feel sick to your stomach (nauseous), and you throw up (vomit).  You develop a fever that comes on suddenly.  You have abdominal pain during a bowel movement.  Your menstrual periods become heavier than usual. MAKE SURE YOU:  Understand these instructions.  Will watch your condition.  Will get help right away if you are not doing well or get worse. Document Released: 05/30/2005 Document Revised: 06/04/2013 Document Reviewed: 02/04/2013 [facility] Patient Information 2015 [place], [place]. This information is not intended to replace advice given to you by your health care provider. Make sure you discuss any questions you have with your health care provider.

## 2014-10-06 NOTE — ED Provider Notes (Signed)
CSN: [id number]     Arrival date & time 10/06/14  1658 History  This chart was scribed for non-physician practitioner Kerrie Buffalo, NP, working with Benjiman Core, MD by Littie Deeds, ED Scribe. This patient was seen in room APFT20/APFT20 and the patient's care was started at 6:01 PM.      Chief Complaint  Patient presents with  . Abdominal Pain   Patient is a 24 y.o. female presenting with abdominal pain. The history is provided by the patient. No language interpreter was used.  Abdominal Pain Pain location:  RLQ Pain quality: throbbing   Pain radiates to:  Does not radiate Pain severity:  Severe Onset quality:  Gradual Duration:  3 days Progression:  Worsening Chronicity:  New Context: not recent sexual activity   Associated symptoms: nausea   Associated symptoms: no chills, no constipation, no diarrhea, no fever and no vomiting     HPI Comments: Amber Byrd is a 24 y.o. female who presents to the Emergency Department complaining of gradual onset, throbbing, worsening, severe RLQ abdominal pain that started 3 days ago. Patient also reports having associated nausea for the past 3 days and loss of appetite. The pain is rated a 10/10 in severity. Patient denies fever, chills, vomiting, diarrhea, and constipation. She also denies sexual activity and hx of ovarian cysts. She has a history of an abnormal pap smear, and she has had 2 biopsies done. Her LNMP was last week. Patient had called her PCP and was instructed to come here with concern for appendicitis.  Gravida 3/Para 2/1 ectopic pregnancy  History reviewed. No pertinent past medical history. Past Surgical History  Procedure Laterality Date  . 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 Diabetes Mother   . Heart disease Mother   . Depression Father   . Arthritis Father    History  Substance Use Topics  . Smoking status: Never Smoker   . Smokeless tobacco: Never Used  .  Alcohol Use: No   OB History    Gravida Para Term Preterm AB TAB SAB Ectopic Multiple Living   Review of Systems  Constitutional: Positive for appetite change. Negative for fever and chills.  Gastrointestinal: Positive for nausea and abdominal pain. Negative for vomiting, diarrhea and constipation.  Allergic/Immunologic: Positive for environmental allergies.  all other systems negative    Allergies  Review of patient's allergies indicates no known allergies.  Home Medications   Prior to Admission medications   Medication Sig Start Date End Date Taking? Authorizing Provider  Carboxymethylcellulose Sodium 0.25 % SOLN Apply 1 drop to eye daily.   Yes Historical Provider, MD  cycloSPORINE (RESTASIS) 0.05 % ophthalmic emulsion Place 1 drop into both eyes 2 (two) times daily.   Yes Historical Provider, MD  loratadine (CLARITIN) 10 MG tablet Take 10 mg by mouth daily.   Yes Historical Provider, MD  Olopatadine HCl 0.2 % SOLN Apply 1 drop to eye every morning.   Yes Historical Provider, MD  Polyvinyl Alcohol-Povidone (REFRESH OP) Apply 1 drop to eye daily with supper.   Yes Historical Provider, MD  sertraline (ZOLOFT) 50 MG tablet Take 1 tablet (50 mg total) by mouth daily. 08/12/13  Yes Deatra Canter, FNP  TRAZODONE HCL PO Take 2 tablets by mouth at bedtime.   Yes Historical Provider, MD  amoxicillin (AMOXIL) 875 MG tablet  Take 1 tablet (875 mg total) by mouth 2 (two) times daily. Patient not taking: Reported on 07/11/2014 08/12/13   Deatra CanterWilliam J Oxford, FNP  HYDROcodone-acetaminophen (NORCO/VICODIN) 5-325 MG per tablet Take 1 tablet by mouth every 4 (four) hours as needed. 10/06/14   Appolonia Ackert Orlene OchM Andera Cranmer, NP  naproxen sodium (ANAPROX DS) 550 MG tablet Take 1 tablet (550 mg total) by mouth 2 (two) times daily with a meal. 10/06/14   Avanni Turnbaugh Orlene OchM Jarquez Mestre, NP  Vitamin D, Ergocalciferol, (DRISDOL) 50000 UNITS CAPS capsule Take 1 capsule (50,000 Units total) by mouth every 7 (seven) days. Patient  not taking: Reported on 07/11/2014 08/13/13   Deatra CanterWilliam J Oxford, FNP   BP 131/83 mmHg  Pulse 64  Temp(Src) 98 F (36.7 C) (Oral)  Resp 16  Ht 5\' 3"  (1.6 m)  Wt 158 lb (71.668 kg)  BMI 28.00 kg/m2  SpO2 100%  LMP 09/29/2014 Physical Exam  Constitutional: She is oriented to person, place, and time. She appears well-developed and well-nourished. No distress.  HENT:  Head: Normocephalic and atraumatic.  Eyes: Conjunctivae and EOM are normal.  Neck: Normal range of motion. Neck supple.  Cardiovascular: Normal rate, regular rhythm and normal heart sounds.   Pulmonary/Chest: Effort normal and breath sounds normal. No respiratory distress. She has no wheezes. She has no rales.  CTAB.  Abdominal: Soft. Normal appearance and bowel sounds are normal. She exhibits no distension, no ascites and no mass. There is tenderness in the right lower quadrant. There is no rigidity, no rebound, no guarding and no CVA tenderness.  Genitourinary:  External genitalia without lesions, white d/c vaginal vault. Cervix with irritation due to recent LEEP procedure. No CMT, right adnexal tenderness, uterus without palpable enlargement.   Musculoskeletal: Normal range of motion.  Neurological: She is alert and oriented to person, place, and time. No cranial nerve deficit.  Skin: Skin is warm and dry.  Psychiatric: She has a normal mood and affect. Her behavior is normal.  Nursing note and vitals reviewed.   ED Course  Procedures  Labs, CT, Zofran, Toradol  DIAGNOSTIC STUDIES: Oxygen Saturation is 100% on room air, normal by my interpretation.    COORDINATION OF CARE: 6:08 PM-Discussed treatment plan which includes labs, medications and CT imaging with patient/guardian at bedside and patient/guardian agreed to plan.   Results for orders placed or performed during the hospital encounter of 10/06/14 (from the past 24 hour(s))  CBC with Differential     Status: None   Collection Time: 10/06/14  5:59 PM  Result  Value Ref Range   WBC 5.7 4.0 - 10.5 K/uL   RBC 4.11 3.87 - 5.11 MIL/uL   Hemoglobin 12.8 12.0 - 15.0 g/dL   HCT 14.737.2 82.936.0 - 56.246.0 %   MCV 90.5 78.0 - 100.0 fL   MCH 31.1 26.0 - 34.0 pg   MCHC 34.4 30.0 - 36.0 g/dL   RDW 13.011.9 86.511.5 - 78.415.5 %   Platelets 244 150 - 400 K/uL   Neutrophils Relative % 61 43 - 77 %   Neutro Abs 3.4 1.7 - 7.7 K/uL   Lymphocytes Relative 27 12 - 46 %   Lymphs Abs 1.5 0.7 - 4.0 K/uL   Monocytes Relative 7 3 - 12 %   Monocytes Absolute 0.4 0.1 - 1.0 K/uL   Eosinophils Relative 5 0 - 5 %   Eosinophils Absolute 0.3 0.0 - 0.7 K/uL   Basophils Relative 0 0 - 1 %   Basophils Absolute 0.0 0.0 - 0.1  K/uL  Comprehensive metabolic panel     Status: Abnormal   Collection Time: 10/06/14  5:59 PM  Result Value Ref Range   Sodium 132 (L) 135 - 145 mmol/L   Potassium 3.2 (L) 3.5 - 5.1 mmol/L   Chloride 103 96 - 112 mmol/L   CO2 26 19 - 32 mmol/L   Glucose, Bld 108 (H) 70 - 99 mg/dL   BUN 12 6 - 23 mg/dL   Creatinine, Ser 1.61 0.50 - 1.10 mg/dL   Calcium 8.7 8.4 - 09.6 mg/dL   Total Protein 7.4 6.0 - 8.3 g/dL   Albumin 4.1 3.5 - 5.2 g/dL   AST 25 0 - 37 U/L   ALT 25 0 - 35 U/L   Alkaline Phosphatase 74 39 - 117 U/L   Total Bilirubin 0.2 (L) 0.3 - 1.2 mg/dL   GFR calc non Af Amer >90 >90 mL/min   GFR calc Af Amer >90 >90 mL/min   Anion gap 3 (L) 5 - 15  Urinalysis, Routine w reflex microscopic     Status: Abnormal   Collection Time: 10/06/14  6:11 PM  Result Value Ref Range   Color, Urine YELLOW YELLOW   APPearance CLEAR CLEAR   Specific Gravity, Urine 1.020 1.005 - 1.030   pH 6.0 5.0 - 8.0   Glucose, UA NEGATIVE NEGATIVE mg/dL   Hgb urine dipstick NEGATIVE NEGATIVE   Bilirubin Urine NEGATIVE NEGATIVE   Ketones, ur TRACE (A) NEGATIVE mg/dL   Protein, ur NEGATIVE NEGATIVE mg/dL   Urobilinogen, UA 0.2 0.0 - 1.0 mg/dL   Nitrite NEGATIVE NEGATIVE   Leukocytes, UA MODERATE (A) NEGATIVE  Urine microscopic-add on     Status: Abnormal   Collection Time: 10/06/14   6:11 PM  Result Value Ref Range   Squamous Epithelial / LPF MANY (A) RARE   WBC, UA 0-2 <3 WBC/hpf   RBC / HPF 0-2 <3 RBC/hpf   Bacteria, UA MANY (A) RARE  Wet prep, genital     Status: Abnormal   Collection Time: 10/06/14  6:43 PM  Result Value Ref Range   Yeast Wet Prep HPF POC NONE SEEN NONE SEEN   Trich, Wet Prep NONE SEEN NONE SEEN   Clue Cells Wet Prep HPF POC FEW (A) NONE SEEN   WBC, Wet Prep HPF POC MANY (A) NONE SEEN  POC urine preg, ED (not at [facility])     Status: None   Collection Time: 10/06/14  7:05 PM  Result Value Ref Range   Preg Test, Ur NEGATIVE NEGATIVE     Imaging Review Ct Abdomen Pelvis W Contrast  10/06/2014   CLINICAL DATA:  Right lower quadrant pain x3 days, decreased appetite, nausea  EXAM: CT ABDOMEN AND PELVIS WITH CONTRAST  TECHNIQUE: Multidetector CT imaging of the abdomen and pelvis was performed using the standard protocol following bolus administration of intravenous contrast.  CONTRAST:  OMNIPAQUE IOHEXOL 300 MG/ML  SOLN  COMPARISON:  None.  FINDINGS: Lower chest:  Lung bases are clear.  Hepatobiliary: Liver is within normal limits. No suspicious/enhancing hepatic lesions.  Gallbladder is decompressed. No intrahepatic or extrahepatic ductal dilatation.  Pancreas: Within normal limits.  Spleen: Within normal limits.  Adrenals/Urinary Tract: Adrenal glands are within normal limits.  Kidneys are within normal limits.  No hydronephrosis.  Bladder is within normal limits.  Stomach/Bowel: Stomach is within normal limits.  No evidence of bowel obstruction.  Appendix is mildly prominent, but without associated inflammatory changes.  Vascular/Lymphatic: No evidence of abdominal  aortic aneurysm. Retro aortic left renal vein.  No suspicious abdominopelvic lymphadenopathy.  Reproductive: Uterus is within normal limits.  Left ovary is within normal limits.  Right ovary is notable for a dominant 3.1 cm cyst/follicle, although with associated solid component medially (series  2/image 65).  Other: No abdominopelvic ascites.  Mild stranding in the anterior pelvic mesentery (series 2/image 65).  Musculoskeletal: Visualized osseous structures are within normal limits.  IMPRESSION: No evidence of bowel obstruction.  No evidence of appendicitis.  Mild stranding in the anterior pelvic mesenteric, correlate for PID.  3.1 cm mildly complex right ovarian cystic lesion with possible solid component. Given patient's young age, this still likely reflects a physiologic cyst or possibly an endometrioma. Consider follow-up pelvic ultrasound in 6-12 weeks to assess for resolution.   Electronically Signed   By: Charline Bills M.D.   On: 10/06/2014 19:42   Significant relief with Toradol.    MDM  24 y.o. female with RLQ abdominal pain and nausea x 3 day. Stable for d/c without acute abdomen. Pain resolved with Toradol 30 mg. IV. Will treat for ovarian cyst pain and she will follow up with her GYN. She will return here as needed for any problems. Discussed with the patient clinical and CT findings and plan of care. She voices understanding and agrees with plan.   Final diagnoses:  Ovarian cyst, right   I personally performed the services described in this documentation, which was scribed in my presence. The recorded information has been reviewed and is accurate.    [place], [place] 10/06/14 2100  Benjiman Core, MD 10/08/14 [phone]

## 2014-10-06 NOTE — ED Notes (Signed)
Pt reports R sided abd pain, onset 3 days ago. Pt denies emesis or diarrhea.

## 2014-10-08 LAB — GC/CHLAMYDIA PROBE AMP (~~LOC~~) NOT AT ARMC
Chlamydia: NEGATIVE
Neisseria Gonorrhea: NEGATIVE

## 2014-10-08 LAB — HIV ANTIBODY (ROUTINE TESTING W REFLEX): HIV Screen 4th Generation wRfx: NONREACTIVE

## 2014-10-08 LAB — SYPHILIS: RPR W/REFLEX TO RPR TITER AND TREPONEMAL ANTIBODIES, TRADITIONAL SCREENING AND DIAGNOSIS ALGORITHM: RPR Ser Ql: NONREACTIVE

## 2015-07-04 ENCOUNTER — Emergency Department (HOSPITAL_COMMUNITY)
Admission: EM | Admit: 2015-07-04 | Discharge: 2015-07-04 | Disposition: A | Discharge status: Home / Self Care | Payer: Medicaid Other | Attending: Emergency Medicine | Admitting: Emergency Medicine

## 2015-07-04 ENCOUNTER — Encounter (HOSPITAL_COMMUNITY): Payer: Self-pay

## 2015-07-04 DIAGNOSIS — R51 Headache: Secondary | ICD-10-CM | POA: Diagnosis present

## 2015-07-04 DIAGNOSIS — J020 Streptococcal pharyngitis: Secondary | ICD-10-CM | POA: Insufficient documentation

## 2015-07-04 DIAGNOSIS — Z791 Long term (current) use of non-steroidal anti-inflammatories (NSAID): Secondary | ICD-10-CM | POA: Insufficient documentation

## 2015-07-04 DIAGNOSIS — G43809 Other migraine, not intractable, without status migrainosus: Secondary | ICD-10-CM | POA: Insufficient documentation

## 2015-07-04 DIAGNOSIS — Z79899 Other long term (current) drug therapy: Secondary | ICD-10-CM | POA: Diagnosis not present

## 2015-07-04 HISTORY — DX: Migraine, unspecified, not intractable, without status migrainosus: G43.909

## 2015-07-04 LAB — RAPID STREP SCREEN (MED CTR MEBANE ONLY): STREPTOCOCCUS, GROUP A SCREEN (DIRECT): POSITIVE — AB

## 2015-07-04 MED ORDER — METOCLOPRAMIDE HCL 10 MG PO TABS: 10.0000 mg | ORAL_TABLET | Freq: Four times a day (QID) | ORAL | Status: DC | PRN

## 2015-07-04 MED ORDER — DIPHENHYDRAMINE HCL 25 MG PO CAPS
50.0000 mg | ORAL_CAPSULE | Freq: Once | ORAL | Status: AC
  Administered 2015-07-04: 50 mg via ORAL
  Filled 2015-07-04: qty 2

## 2015-07-04 MED ORDER — METOCLOPRAMIDE HCL 5 MG/ML IJ SOLN
10.0000 mg | Freq: Once | INTRAMUSCULAR | Status: AC
  Administered 2015-07-04: 10 mg via INTRAMUSCULAR
  Filled 2015-07-04: qty 2

## 2015-07-04 MED ORDER — PENICILLIN G BENZATHINE 1200000 UNIT/2ML IM SUSP
1.2000 10*6.[IU] | Freq: Once | INTRAMUSCULAR | Status: AC
  Administered 2015-07-04: 1.2 10*6.[IU] via INTRAMUSCULAR
  Filled 2015-07-04: qty 2

## 2015-07-04 MED ORDER — KETOROLAC TROMETHAMINE 60 MG/2ML IM SOLN
60.0000 mg | Freq: Once | INTRAMUSCULAR | Status: AC
  Administered 2015-07-04: 60 mg via INTRAMUSCULAR
  Filled 2015-07-04: qty 2

## 2015-07-04 NOTE — ED Notes (Signed)
Patient verbalizes understanding of discharge instructions, prescription medications, home care and follow up care. Patient ambulatory out of department at this time with family. 

## 2015-07-04 NOTE — Discharge Instructions (Signed)
°Emergency Department Resource Guide °1) Find a Doctor and Pay Out of Pocket °Although you won't have to find out who is covered by your insurance plan, it is a good idea to ask around and get recommendations. You will then need to call the office and see if the doctor you have chosen will accept you as a new patient and what types of options they offer for patients who are self-pay. Some doctors offer discounts or will set up payment plans for their patients who do not have insurance, but you will need to ask so you aren't surprised when you get to your appointment. ° °2) Contact Your Local Health Department °Not all health departments have doctors that can see patients for sick visits, but many do, so it is worth a call to see if yours does. If you don't know where your local health department is, you can check in your phone book. The CDC also has a tool to help you locate your state's health department, and many state websites also have listings of all of their local health departments. ° °3) Find a Walk-in Clinic °If your illness is not likely to be very severe or complicated, you may want to try a walk in clinic. These are popping up all over the country in pharmacies, drugstores, and shopping centers. They're usually staffed by nurse practitioners or physician assistants that have been trained to treat common illnesses and complaints. They're usually fairly quick and inexpensive. However, if you have serious medical issues or chronic medical problems, these are probably not your best option. ° °No Primary Care Doctor: °- Call [facility] at  832-8000 - they can help you locate a primary care doctor that  accepts your insurance, provides certain services, etc. °- Physician Referral Service- 1-800-533-3463 ° °Chronic Pain Problems: °Organization         Address  Phone   Notes  °[facility]  (336) 297-2271 Patients need to be referred by their primary care doctor.  ° °Medication  Assistance: °Organization         Address  Phone   Notes  °[place] [facility] [address] °[place], [place] [address] (336) 641-8030 --Must be a resident of [place] °-- Must have NO insurance coverage whatsoever (no Medicaid/ Medicare, etc.) °-- The pt. MUST have a primary care doctor that directs their care regularly and follows them in the community °  °[facility]  (866) 331-1348   °[employer]  (888) 892-1162   ° °Agencies that provide inexpensive medical care: °Organization         Address  Phone   Notes  °[facility]  (336) 832-8035   °[facility]    (336) 832-7272   °[facility] [address] °[place], [place] [address] (336) 832-4777   °[facility] [address], °[place] (336) 271-4999   °[facility]    (336) 373-0678   °[facility]    (336) 272-1050   °[facility] ° [address], [place] Phone:  (336) 832-4444, Fax:  (336) 832-4440 Hours of Operation:  9 am - 6 pm, M-F.  Also accepts Medicaid/Medicare and self-pay.  °[facility] ° [address], [place] Phone: (336) 832-3150, Fax: (336) [phone]. Hours of Operation:  8:30 am - 5:30 pm, M-F.  Also accepts Medicaid and self-pay.  °[facility] [place] [address], [place] Phone: (336) 878-6027   °[facility] [address], [place], [place] (336)723-1848, Ext. 123 Mondays & Thursdays: 7-9 AM.  First 15 patients are seen on a first come, first serve basis. °  ° °Medicaid-accepting [place] Providers: ° °Organization         Address  Phone   Notes  °[facility] [address], [place] (336) 641-2100 Also accepts self-pay patients.  °[facility] [address], [place] ° (336) 856-9996   °[facility] [address], [place]  (336) 288-8857   °[facility] [address] [place] [address], [place] (336) 299-7000   °Veita Bland [address], [place]  ° (336) 373-1557 Only accepts [place] Access Medicaid patients after they have their name applied to their card.  ° °Self-Pay (no insurance) in [place]: ° °Organization         Address  Phone   Notes  °Sickle Cell Patients, [facility] [address], [place] (336) 832-1970   °[facility] [address], [place] (336) 832-4400   °[facility] ° [address], [place] (336) 992-4800   °[facility]/Dr. Osei-Bonsu ° [address] [place] [address], [place] or [address], [place] (336) 841-8500 Phone number for both [place] and [place] locations is the same.  °[facility] [address], [place] (336) 299-0000   °[facility] [address] [place] [address], [place] or [address] (336) 852-7530 °(336) 878-2260   °[facility] [address], [place] (336) 350-1642, phone; (336) 294-5005, fax Sees patients 1st and 3rd Saturday of every month.  Must not qualify for public or private insurance (i.e. Medicaid, Medicare, [place] Health Choice, Veterans' Benefits) • Household income should be no more than 200% of the poverty level •The clinic cannot treat you if you are pregnant or think you are pregnant • Sexually transmitted diseases are not treated at the clinic.  ° ° °Dental Care: °Organization         Address  Phone  Notes  °[place] [facility] [address], [place] (336) 641-6152 Accepts children up to age 21 who are enrolled in Medicaid or [place] Health Choice; pregnant women with a Medicaid card; and children who have applied for Medicaid or [place] Health Choice, but were declined, whose parents can pay a reduced fee at time of service.  °[place]  [facility] [place]  [address], [place] (336) 641-7733 Accepts children up to age 21 who are enrolled in Medicaid or [place] Health Choice; pregnant women with a Medicaid card; and children who have applied for Medicaid or [place] Health Choice, but were declined, whose parents can pay a reduced fee at time of service.  °[facility] ° [address], [place] (336) 641-4533 Patients are seen by appointment only. Walk-ins are not accepted. [facility] will see patients 18 years of age and older. °Monday - Tuesday (8am-5pm) °Most Wednesdays (8:30-5pm) °$30 per visit, cash only  °[facility] ° [address], [place] (336) 641-4533 Patients are seen by appointment only. Walk-ins are not accepted. [facility] will see patients 18 years of age and older. °One   Wednesday Evening (Monthly: Volunteer Based).  $30 per visit, cash only  °[facility]  (919) 537-3737 for adults; Children under age 4, call Graduate Pediatric Dentistry at (919) [phone]. Children aged 4-14, please call (919) [phone] to request a pediatric application. ° Dental services are provided in all areas of dental care including fillings, crowns and bridges, complete and partial dentures, implants, gum treatment, root canals, and extractions. Preventive care is also provided. Treatment is provided to both adults and children. °Patients are selected via a lottery and there is often a waiting list. °  °[facility] [address], °[place] ° (336) 763-8833 [email or web address] °  °[facility] [address], [place], [place] (336)723-1848, Ext. 123 Second and Fourth Thursday of each month, opens at 6:30 AM; Clinic ends at 9 AM.  Patients are seen on a first-come first-served basis, and a limited number are seen during each clinic.  ° °[facility] ° [address], [place], [place] (336) 723-7904    Eligibility Requirements °You must have lived in [place], or [place] counties for at least the last three months. °  You cannot be eligible for state or federal sponsored healthcare insurance, including Veterans Administration, Medicaid, or Medicare. °  You generally cannot be eligible for healthcare insurance through your employer.  °  How to apply: °Eligibility screenings are held every Tuesday and Wednesday afternoon from 1:00 pm until 4:00 pm. You do not need an appointment for the interview!  °[facility] [address], [place], [place] 336-631-2330   °[facility]  336-342-8273   °[facility]  336-703-3100   °[facility]  336-570-6415   ° °Behavioral Health Resources in the Community: °Intensive Outpatient Programs °Organization         Address  Phone  Notes  °[facility] [address], [place], [place] 336-878-6098   °[facility] [address], [place], [place] 336-832-9800   °[facility] [address], [place], [place] ° 336-882-2125   °[facility] [address],  °[place], [place] 1-800-853-5163 or 336-641-4981   °Substance Abuse Resources °Organization         Address  Phone  Notes  °[facility]  336-882-2125   °[facility]  336-784-9470   °[facility]  336-285-9073   °[facility]  336-845-3988   °Residential & Outpatient Substance Abuse Program  1-800-659-3381   °Psychological Services °Organization         Address  Phone  Notes  °[facility]  336- 832-9600   °[facility]  336- 378-7881   °[facility] [address], [place] 1-800-853-5163 or 336-641-4981   ° °Mobile Crisis Teams °Organization         Address  Phone  Notes  °[facility], Mobile Crisis Care Unit  1-877-626-1772   °Assertive °[facility] ° [address]  [place], [place] 336-834-9664   °Sharon DeEsch [address] °[place] 336-554-5454   ° °Self-Help/Support Groups °Organization         Address  Phone             Notes  °[facility]  - variety of support groups  336- 373-1402 Call for more information  °Narcotics Anonymous (NA), [facility] [address], °[place]  2 meetings at this location  ° °  Residential Treatment Programs Organization         Address  Phone  Notes  [facility] [address]    [place]  1-610-960-4540   [facility]  [address], [place] [address], [place], [place] 478-295-6213   [facility] [address] [place], [place] 086-578-4696 Admissions: 8am-3pm M-F  [facility] [address]    [place], [place] 295-284-1324   [facility] [address] [place], [place], [place] 401-027-2536   [facility] [address]  [place], [place] 644-034-7425   [facility] Intensive Outpatient [address], [place], [place] 956-387-5643   [facility] [place] ([facility] [address] [place]  [place], [place] 3-295-188-4166 or 727-016-7970   [facility] [address] [place], [place] 323-557-3220 Accepts Medicaid  [facility] [address],  [place] 2-542-706-2376 Substance Abuse/Addiction Treatment   [facility] Organization         Address  Phone  Notes  [facility]  248-168-5735   Angie Fava, PhD [address] [place], [place]   785-668-1554 or (954)735-9893   [facility]   [address] [place], [place] (562)855-9645   [facility] [address], [place], [place] 860-692-5375 Insurance/Medicaid/sponsorship through [facility] [address]                                    [place], [place] [phone] Therapy/tele-psych/case    [facility] [address] [place], [place] (216) [phone]    Dr. Lolly Mustache  254-758-2871   [facility]  [employer] [facility] Dept. 1) [address] [place] 2) [address], [place] 3)  [address], [place] [phone]  [phone]   [facility] Child Abuse Hotline (773) 542-4884 or 815 812 4628 (After Hours)      Take over the counter tylenol and ibuprofen (OR excedrin) and benadryl, as directed on packaging, with the prescription given to you today, as needed for headache. You were fully treated for "strep throat" with an injection of penicillin while you were in the Emergency Department.  Gargle with warm water several times per day to help with discomfort.  May also use over the counter sore throat pain medicines such as chloraseptic or sucrets, as directed on packaging, as needed for discomfort.  Call your regular medical doctor on Monday to schedule a follow up appointment in the next 3 days.  Return to the Emergency Department immediately if worsening.

## 2015-07-04 NOTE — ED Provider Notes (Signed)
CSN: [id number]     Arrival date & time 07/04/15  0008 History   First MD Initiated Contact with Patient 07/04/15 [phone]     Chief Complaint  Patient presents with  . Headache      HPI  Pt was seen at 0235. Per pt, c/o gradual onset and persistence of constant acute flair of her chronic migraine headache for the past 3 days.  Describes the headache as per her usual chronic migraine headache pain pattern for many years.  Denies headache was sudden or maximal in onset or at any time.  Denies visual changes, no focal motor weakness, no tingling/numbness in extremities, no fevers, no neck pain, no rash.  Pt also c/o gradual onset and persistence of constant sore throat for the past 3 days. Denies cough/SOB, no CP, no abd pain, no N/V/D, no rash, no fevers.    Past Medical History  Diagnosis Date  . Migraine    Past Surgical History  Procedure Laterality Date  . 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 Diabetes Mother   . Heart disease Mother   . Depression Father   . Arthritis Father    Social History  Substance Use Topics  . Smoking status: Never Smoker   . Smokeless tobacco: Never Used  . Alcohol Use: No   OB History    Gravida Para Term Preterm AB TAB SAB Ectopic Multiple Living   Review of Systems ROS: Statement: All systems negative except as marked or noted in the HPI; Constitutional: Negative for fever and chills. ; ; Eyes: Negative for eye pain, redness and discharge. ; ; ENMT: Negative for ear pain, hoarseness, nasal congestion, sinus pressure and +sore throat. ; ; Cardiovascular: Negative for chest pain, palpitations, diaphoresis, dyspnea and peripheral edema. ; ; Respiratory: Negative for cough, wheezing and stridor. ; ; Gastrointestinal: Negative for nausea, vomiting, diarrhea, abdominal pain, blood in stool, hematemesis, jaundice and rectal bleeding. . ; ; Genitourinary: Negative for dysuria, flank  pain and hematuria. ; ; Musculoskeletal: Negative for back pain and neck pain. Negative for swelling and trauma.; ; Skin: Negative for pruritus, rash, abrasions, blisters, bruising and skin lesion.; ; Neuro: +headache. Negative for lightheadedness and neck stiffness. Negative for weakness, altered level of consciousness , altered mental status, extremity weakness, paresthesias, involuntary movement, seizure and syncope.       Allergies  Review of patient's allergies indicates no known allergies.  Home Medications   Prior to Admission medications   Medication Sig Start Date End Date Taking? Authorizing Provider  Carboxymethylcellulose Sodium 0.25 % SOLN Apply 1 drop to eye daily.   Yes Historical Provider, MD  cycloSPORINE (RESTASIS) 0.05 % ophthalmic emulsion Place 1 drop into both eyes 2 (two) times daily.   Yes Historical Provider, MD  loratadine (CLARITIN) 10 MG tablet Take 10 mg by mouth daily.   Yes Historical Provider, MD  Olopatadine HCl 0.2 % SOLN Apply 1 drop to eye every morning.   Yes Historical Provider, MD  Polyvinyl Alcohol-Povidone (REFRESH OP) Apply 1 drop to eye daily with supper.   Yes Historical Provider, MD  sertraline (ZOLOFT) 50 MG tablet Take 1 tablet (50 mg total) by mouth daily. 08/12/13  Yes Deatra Canter, FNP  topiramate (TOPAMAX) 100 MG tablet Take 100 mg by mouth daily.   Yes Historical Provider, MD  TRAZODONE  HCL PO Take 2 tablets by mouth at bedtime.   Yes Historical Provider, MD  amoxicillin (AMOXIL) 875 MG tablet Take 1 tablet (875 mg total) by mouth 2 (two) times daily. Patient not taking: Reported on 07/11/2014 08/12/13   Deatra Canter, FNP  HYDROcodone-acetaminophen (NORCO/VICODIN) 5-325 MG per tablet Take 1 tablet by mouth every 4 (four) hours as needed. 10/06/14   Hope Orlene Och, NP  naproxen sodium (ANAPROX DS) 550 MG tablet Take 1 tablet (550 mg total) by mouth 2 (two) times daily with a meal. 10/06/14   Hope Orlene Och, NP  Vitamin D, Ergocalciferol,  (DRISDOL) 50000 UNITS CAPS capsule Take 1 capsule (50,000 Units total) by mouth every 7 (seven) days. Patient not taking: Reported on 07/11/2014 08/13/13   Anselm Pancoast [facility], FNP   BP 130/86 mmHg  Pulse 110  Temp(Src) 100.2 F (37.9 C) (Oral)  Resp 22  Wt 158 lb (71.668 kg)  SpO2 99% Physical Exam  0240: Physical examination:  Nursing notes reviewed; Vital signs and O2 SAT reviewed;  Constitutional: Well developed, Well nourished, Well hydrated, In no acute distress; Head:  Normocephalic, atraumatic; Eyes: EOMI, PERRL, No scleral icterus; ENMT: TM's clear bilat. +edemetous nasal turbinates bilat with clear rhinorrhea. Mouth and pharynx without lesions. +erythema to posterior pharynx, +tonsillar exudates. No intra-oral edema. No submandibular or sublingual edema. No hoarse voice, no drooling, no stridor. No pain with manipulation of larynx. No trismus. Mouth and pharynx normal, Mucous membranes moist; Neck: Supple, Full range of motion, No lymphadenopathy; Cardiovascular: Regular rate and rhythm, No murmur, rub, or gallop; Respiratory: Breath sounds clear & equal bilaterally, No rales, rhonchi, wheezes.  Speaking full sentences with ease, Normal respiratory effort/excursion; Chest: Nontender, Movement normal; Abdomen: Soft, Nontender, Nondistended, Normal bowel sounds; Genitourinary: No CVA tenderness; Extremities: Pulses normal, No tenderness, No edema, No calf edema or asymmetry.; Neuro: AA&Ox3, Major CN grossly intact.  Speech clear. No gross focal motor or sensory deficits in extremities.; Skin: Color normal, Warm, Dry.   ED Course  Procedures (including critical care time) Labs Review  Imaging Review  I have personally reviewed and evaluated these images and lab results as part of my medical decision-making.   EKG Interpretation None      MDM  MDM Reviewed: previous chart, nursing note and vitals Interpretation: labs      Results for orders placed or performed during the hospital  encounter of 07/04/15  Rapid strep screen  Result Value Ref Range   Streptococcus, Group A Screen (Direct) POSITIVE (A) NEGATIVE    0340:  IM PCN given while in the ED. Feels headache is better after meds (has been sleeping) and wants to go home now. Tx headache symptomatically at this time, f/u PMD. Dx and testing d/w pt and family.  Questions answered.  Verb understanding, agreeable to d/c home with outpt f/u.     Samuel Jester, DO 07/07/15 1348

## 2015-07-04 NOTE — ED Notes (Addendum)
Migraine headache x 3 days, vomited x 1, taking topamax for same.  Pt also c/o sore throat

## 2018-08-17 ENCOUNTER — Inpatient Hospital Stay (HOSPITAL_COMMUNITY)
Admission: AD | Admit: 2018-08-17 | Discharge: 2018-08-21 | DRG: 417 | Disposition: A | Discharge status: Home / Self Care | Payer: Medicaid Other | Source: Other Acute Inpatient Hospital | Attending: Internal Medicine | Admitting: Internal Medicine

## 2018-08-17 DIAGNOSIS — K2980 Duodenitis without bleeding: Secondary | ICD-10-CM | POA: Diagnosis present

## 2018-08-17 DIAGNOSIS — R7989 Other specified abnormal findings of blood chemistry: Secondary | ICD-10-CM | POA: Diagnosis present

## 2018-08-17 DIAGNOSIS — F329 Major depressive disorder, single episode, unspecified: Secondary | ICD-10-CM | POA: Diagnosis present

## 2018-08-17 DIAGNOSIS — R935 Abnormal findings on diagnostic imaging of other abdominal regions, including retroperitoneum: Secondary | ICD-10-CM

## 2018-08-17 DIAGNOSIS — R1013 Epigastric pain: Secondary | ICD-10-CM | POA: Diagnosis present

## 2018-08-17 DIAGNOSIS — K851 Biliary acute pancreatitis without necrosis or infection: Secondary | ICD-10-CM | POA: Diagnosis present

## 2018-08-17 DIAGNOSIS — Z8261 Family history of arthritis: Secondary | ICD-10-CM

## 2018-08-17 DIAGNOSIS — K8071 Calculus of gallbladder and bile duct without cholecystitis with obstruction: Secondary | ICD-10-CM | POA: Diagnosis present

## 2018-08-17 DIAGNOSIS — Z818 Family history of other mental and behavioral disorders: Secondary | ICD-10-CM

## 2018-08-17 DIAGNOSIS — K8050 Calculus of bile duct without cholangitis or cholecystitis without obstruction: Secondary | ICD-10-CM

## 2018-08-17 DIAGNOSIS — R945 Abnormal results of liver function studies: Secondary | ICD-10-CM

## 2018-08-17 DIAGNOSIS — Z8249 Family history of ischemic heart disease and other diseases of the circulatory system: Secondary | ICD-10-CM | POA: Diagnosis not present

## 2018-08-17 DIAGNOSIS — K8051 Calculus of bile duct without cholangitis or cholecystitis with obstruction: Secondary | ICD-10-CM | POA: Diagnosis not present

## 2018-08-17 DIAGNOSIS — K5900 Constipation, unspecified: Secondary | ICD-10-CM | POA: Diagnosis present

## 2018-08-17 DIAGNOSIS — Z833 Family history of diabetes mellitus: Secondary | ICD-10-CM

## 2018-08-17 DIAGNOSIS — E0781 Sick-euthyroid syndrome: Secondary | ICD-10-CM | POA: Diagnosis present

## 2018-08-17 DIAGNOSIS — E876 Hypokalemia: Secondary | ICD-10-CM | POA: Diagnosis not present

## 2018-08-17 DIAGNOSIS — K831 Obstruction of bile duct: Secondary | ICD-10-CM | POA: Diagnosis not present

## 2018-08-17 DIAGNOSIS — J302 Other seasonal allergic rhinitis: Secondary | ICD-10-CM | POA: Diagnosis present

## 2018-08-17 DIAGNOSIS — K859 Acute pancreatitis without necrosis or infection, unspecified: Secondary | ICD-10-CM | POA: Diagnosis present

## 2018-08-17 LAB — CBC WITH DIFFERENTIAL/PLATELET
Abs Immature Granulocytes: 0.01 10*3/uL (ref 0.00–0.07)
Basophils Absolute: 0 10*3/uL (ref 0.0–0.1)
Basophils Relative: 1 %
EOS PCT: 1 %
Eosinophils Absolute: 0.1 10*3/uL (ref 0.0–0.5)
HCT: 32.4 % — ABNORMAL LOW (ref 36.0–46.0)
Hemoglobin: 11.1 g/dL — ABNORMAL LOW (ref 12.0–15.0)
IMMATURE GRANULOCYTES: 0 %
Lymphocytes Relative: 27 %
Lymphs Abs: 1.2 10*3/uL (ref 0.7–4.0)
MCH: 29.8 pg (ref 26.0–34.0)
MCHC: 34.3 g/dL (ref 30.0–36.0)
MCV: 87.1 fL (ref 80.0–100.0)
Monocytes Absolute: 0.4 10*3/uL (ref 0.1–1.0)
Monocytes Relative: 9 %
NRBC: 0 % (ref 0.0–0.2)
Neutro Abs: 2.7 10*3/uL (ref 1.7–7.7)
Neutrophils Relative %: 62 %
PLATELETS: 175 10*3/uL (ref 150–400)
RBC: 3.72 MIL/uL — ABNORMAL LOW (ref 3.87–5.11)
RDW: 14.4 % (ref 11.5–15.5)
WBC: 4.4 10*3/uL (ref 4.0–10.5)

## 2018-08-17 LAB — PROTIME-INR
INR: 1 (ref 0.8–1.2)
Prothrombin Time: 13.4 seconds (ref 11.4–15.2)

## 2018-08-17 MED ORDER — ONDANSETRON HCL 4 MG/2ML IJ SOLN: 4.0000 mg | Freq: Four times a day (QID) | INTRAMUSCULAR | Status: DC | PRN

## 2018-08-17 MED ORDER — SODIUM CHLORIDE 0.9 % IV SOLN
INTRAVENOUS | Status: DC
  Administered 2018-08-17 – 2018-08-18 (×2): via INTRAVENOUS

## 2018-08-17 MED ORDER — MORPHINE SULFATE (PF) 2 MG/ML IV SOLN
1.0000 mg | INTRAVENOUS | Status: DC | PRN
  Administered 2018-08-18 – 2018-08-19 (×2): 1 mg via INTRAVENOUS
  Filled 2018-08-17 (×2): qty 1

## 2018-08-17 MED ORDER — ONDANSETRON HCL 4 MG PO TABS: 4.0000 mg | ORAL_TABLET | Freq: Four times a day (QID) | ORAL | Status: DC | PRN

## 2018-08-17 NOTE — Progress Notes (Signed)
Pt arrived to [address]. Admission notified of pt arrival. Pt identified appropriately, ambulated from stretcher to bed, steady gait. VS stable, pt denied pain at this time. No chest pain, SOB, no signs of acute distress. Pt alert and oriented x 4, mother at the bedside. Pt oriented to room and equipment, instructed to call for assistance as needed. Pt verbalized understanding of using call bell, and call bell left within reach. Spoken to [place], MD, she will come and assess pt. Will continue to monitor and treat pt per MD orders.

## 2018-08-17 NOTE — H&P (Signed)
Amber Byrd FBP:[id number] DOB: [birth year] DOA: 08/17/2018     PCP: Richardean Chimera, MD   Outpatient Specialists:   Patient arrived to ER on  at   Patient coming from: home Lives   With family    Chief Complaint: Jaundice   HPI: Amber Byrd is a 28 y.o. female with medical history significant of seasonal allergies and depression    Presented with 1wk of Upper Abd pain loss of appetite no fever no diarrhea, she has nausea   vomiting if she eats. Unable to to a keep anything down  have been seen by PCP getting worse she was noted to be jaundiced and presented to [facility]  not febrile  pain controlled now had Compazine and and Benadryl in ER  Bili 6.0 AST 253 ALT 375 Lipase 1734  US showed Blocked bile ducked 72mm  With 6 mm  Calculus  needs ERCP  No GI available  She has a 5 month at home no prior gallstone problems     While in ER:  The following Work up has been ordered so far:  Orders Placed This Encounter  Procedures  . Comprehensive metabolic panel  . Protime-INR  . CBC with Differential/Platelet  . HIV antibody (Routine Testing)  . Magnesium  . Phosphorus  . TSH  . Comprehensive metabolic panel  . CBC  . Hepatitis panel, acute  . Lipid panel  . Lipase, blood  . Lipase, blood  . Pregnancy, urine  . Diet NPO time specified  . Vital signs  . Notify physician  . Up with assistance  . If patient diabetic or glucose greater than 140 notify physician for Sliding Scale Insulin Orders  . May go off telemetry for tests/procedures  . Oral care per nursing protocol  . Initiate Oral Care Protocol  . Initiate Carrier Fluid Protocol  . RN may order General Admission PRN Orders utilizing "General Admission PRN medications" (through manage orders) for the following patient needs: allergy symptoms (Claritin), cold sores (Carmex), cough (Robitussin DM), eye irritation (Liquifilm Tears), hemorrhoids (Tucks), indigestion (Maalox), minor skin irritation  (Hydrocortisone Cream), muscle pain Romeo Apple Gay), nose irritation (saline nasal spray) and sore throat (Chloraseptic spray).  . SCDs  . Full code  . Pulse oximetry check with vital signs  . Oxygen therapy Mode or (Route): Nasal cannula; Liters Per Minute: 2; Keep 02 saturation: greater than 92 %  . Incentive spirometry  . EKG 12-Lead  . Admit to Inpatient (patient's expected length of stay will be greater than 2 midnights or inpatient only procedure)     Following Medications were ordered in ER: Medications  ondansetron (ZOFRAN) tablet 4 mg (has no administration in time range)    Or  ondansetron (ZOFRAN) injection 4 mg (has no administration in time range)  0.9 %  sodium chloride infusion ( Intravenous New Bag/Given 08/17/18 2328)  morphine 2 MG/ML injection 1 mg (has no administration in time range)    Significant initial  Findings: Abnormal Labs Reviewed  COMPREHENSIVE METABOLIC PANEL - Abnormal; Notable for the following components:      Result Value   Potassium 3.2 (*)    BUN 5 (*)    Calcium 8.2 (*)    Total Protein 6.1 (*)    Albumin 3.1 (*)    AST 217 (*)    ALT 304 (*)    Alkaline Phosphatase 221 (*)    Total Bilirubin 6.0 (*)    All other components within normal limits  CBC WITH DIFFERENTIAL/PLATELET - Abnormal; Notable for the following components:   RBC 3.72 (*)    Hemoglobin 11.1 (*)    HCT 32.4 (*)    All other components within normal limits    Cr  stable,  Up from baseline see below Lab Results  Component Value Date   CREATININE 0.73 10/06/2014   CREATININE 0.67 08/12/2013      WBC  4.4  HG/HCT    stable,      Component Value Date/Time   HGB 12.8 10/06/2014 1759   HCT 37.2 10/06/2014 1759       UA  no evidence of UTI at [place]    [place] cholelithiasis without evidence of cholecystitis choledocholithiasis causing biliary obstruction common bile duct diameter 13.2 mm to 6 mm calculus seen in the mid to distal duct  ECG: not obtained   ED  Triage Vitals  Enc Vitals Group     BP 08/17/18 2241 122/71     Pulse Rate 08/17/18 2241 74     Resp 08/17/18 2241 16     Temp 08/17/18 2241 97.9 F (36.6 C)     Temp Source 08/17/18 2241 Oral     SpO2 08/17/18 2241 98 %     Weight 08/17/18 2241 176 lb 6.4 oz (80 kg)     Height 08/17/18 2241  (1.6 m)     Head Circumference --      Peak Flow --      Pain Score 08/17/18 2236 0     Pain Loc --      Pain Edu? --      Excl. in GC? --   TMAX(24)@       Latest  Blood pressure 122/71, pulse 74, temperature 97.9 F (36.6 C), temperature source Oral, resp. rate 16, height  (1.6 m), weight 80 kg, SpO2 98 %.    This Provider Called:     Dr.Buccini   They Recommend admit to medicine   Will see in AM   Hospitalist was called for admission for Biliary obstruction    Review of Systems:    Pertinent positives include:  abdominal pain, nausea, jaundice  Constitutional:  No weight loss, night sweats, Fevers, chills, fatigue, weight loss  HEENT:  No headaches, Difficulty swallowing,Tooth/dental problems,Sore throat,  No sneezing, itching, ear ache, nasal congestion, post nasal drip,  Cardio-vascular:  No chest pain, Orthopnea, PND, anasarca, dizziness, palpitations.no Bilateral lower extremity swelling  GI:  No heartburn, indigestion, vomiting, diarrhea, change in bowel habits, loss of appetite, melena, blood in stool, hematemesis Resp:  no shortness of breath at rest. No dyspnea on exertion, No excess mucus, no productive cough, No non-productive cough, No coughing up of blood.No change in color of mucus.No wheezing. Skin:  no rash or lesions. No jaundice GU:  no dysuria, change in color of urine, no urgency or frequency. No straining to urinate.  No flank pain.  Musculoskeletal:  No joint pain or no joint swelling. No decreased range of motion. No back pain.  Psych:  No change in mood or affect. No depression or anxiety. No memory loss.  Neuro: no localizing  neurological complaints, no tingling, no weakness, no double vision, no gait abnormality, no slurred speech, no confusion  All systems reviewed and apart from HOPI all are negative  Past Medical History:   Past Medical History:  Diagnosis Date  . Migraine       Past Surgical History:  Procedure Laterality Date  . ear surgery  to repair holes in eardrums      Social History:  Ambulatory   independently      reports that she has never smoked. She has never used smokeless tobacco. She reports that she does not drink alcohol or use drugs.     Family History:   Family History  Problem Relation Age of Onset  . Diabetes Mother   . Heart disease Mother   . Depression Father   . Arthritis Father     Allergies: No Known Allergies   Prior to Admission medications   Medication Sig Start Date End Date Taking? Authorizing Provider  amoxicillin (AMOXIL) 875 MG tablet Take 1 tablet (875 mg total) by mouth 2 (two) times daily. Patient not taking: Reported on 07/11/2014 08/12/13   Deatra Canter, FNP  Carboxymethylcellulose Sodium 0.25 % SOLN Apply 1 drop to eye daily.    [provider]  cycloSPORINE (RESTASIS) 0.05 % ophthalmic emulsion Place 1 drop into both eyes 2 (two) times daily.    [provider]  loratadine (CLARITIN) 10 MG tablet Take 10 mg by mouth daily.    [provider]  metoCLOPramide (REGLAN) 10 MG tablet Take 1 tablet (10 mg total) by mouth every 6 (six) hours as needed for nausea (or headache). 07/04/15   Samuel Jester, DO  naproxen sodium (ANAPROX DS) 550 MG tablet Take 1 tablet (550 mg total) by mouth 2 (two) times daily with a meal. 10/06/14   Neese, Tomas de Castro, NP  Olopatadine HCl 0.2 % SOLN Apply 1 drop to eye every morning.    [provider]  Polyvinyl Alcohol-Povidone (REFRESH OP) Apply 1 drop to eye daily with supper.    [provider]  sertraline (ZOLOFT) 50 MG tablet Take 1 tablet (50 mg total) by mouth daily.  08/12/13   Deatra Canter, FNP  topiramate (TOPAMAX) 100 MG tablet Take 100 mg by mouth daily.    [provider]  TRAZODONE HCL PO Take 2 tablets by mouth at bedtime.    [provider]  Vitamin D, Ergocalciferol, (DRISDOL) 50000 UNITS CAPS capsule Take 1 capsule (50,000 Units total) by mouth every 7 (seven) days. Patient not taking: Reported on 07/11/2014 08/13/13   Deatra Canter, FNP   Physical Exam: Blood pressure 122/71, pulse 74, temperature 97.9 F (36.6 C), temperature source Oral, resp. rate 16, height  (1.6 m), weight 80 kg, SpO2 98 %. 1. General:  in No Acute distress    well  -appearing jaundiced  2. Psychological: Alert and  Oriented 3. Head/ENT:   Dry Mucous Membranes                          Head Non traumatic, neck supple                          Poor Dentition 4. SKIN:  decreased Skin turgor,  Skin clean Dry and intact no rash  5. Heart: Regular rate and rhythm no  Murmur, no Rub or gallop 6. Lungs: Clear to auscultation bilaterally, no wheezes or crackles   7. Abdomen: Soft, non-tender, Non distended  Obese bowel sounds present 8. Lower extremities: no clubbing, cyanosis, no  edema 9. Neurologically Grossly intact, moving all 4 extremities equally  10. MSK: Normal range of motion   LABS:    No results for input(s): WBC, NEUTROABS, HGB, HCT, MCV, PLT in the last 168 hours. Basic  Metabolic Panel: No results for input(s): NA, K, CL, CO2, GLUCOSE, BUN, CREATININE, CALCIUM, MG, PHOS in the last 168 hours.    No results for input(s): AST, ALT, ALKPHOS, BILITOT, PROT, ALBUMIN in the last 168 hours. No results for input(s): LIPASE, AMYLASE in the last 168 hours. No results for input(s): AMMONIA in the last 168 hours.    HbA1C: No results for input(s): HGBA1C in the last 72 hours. CBG: No results for input(s): GLUCAP in the last 168 hours.    Urine analysis:    Component Value Date/Time   COLORURINE YELLOW 10/06/2014 1811   APPEARANCEUR  CLEAR 10/06/2014 1811   LABSPEC 1.020 10/06/2014 1811   PHURINE 6.0 10/06/2014 1811   GLUCOSEU NEGATIVE 10/06/2014 1811   HGBUR NEGATIVE 10/06/2014 1811   BILIRUBINUR NEGATIVE 10/06/2014 1811   KETONESUR TRACE (A) 10/06/2014 1811   PROTEINUR NEGATIVE 10/06/2014 1811   UROBILINOGEN 0.2 10/06/2014 1811   NITRITE NEGATIVE 10/06/2014 1811   LEUKOCYTESUR MODERATE (A) 10/06/2014 1811       Cultures: No results found for: SDES, SPECREQUEST, CULT, REPTSTATUS   Radiological Exams on Admission: No results found.  Chart has been reviewed    Assessment/Plan   28 y.o. female with medical history significant of seasonal allergies and depression  Admitted for common biliary duct dilatation  Present on Admission: . Elevated LFTs due to  . Biliary obstruction-  secondary to biliary obstruction as seen by ultrasound.  Appreciate GI surgery input.  Probably plan for ERCP in a.m.  Keep n.p.o. No fever no elevated white blood cell count hold off on IV antibiotics for now   . Pancreatitis - most likely cause being  gallstone induced      Will rehydrate with aggressive IV fluids Keep n.p.o. Follow clinically     Control pain with IV pain medications      . Hypokalemia - - will replace and repeat in AM,  check magnesium level and replace as needed   Other plan as per orders.  DVT prophylaxis:  SCD     Code Status:  FULL CODE  as per patient   I had personally discussed CODE STATUS with patient   Family Communication:   Family not  at  Bedside    Disposition Plan:      To home once workup is complete and patient is stable                   Consults called:   Enid Baas aware  Admission status:    inpatient     Expect 2 midnight stay secondary to severity of patient's current illness including     Severe lab/radiological/exam abnormalities including: Common biliary duct dilatation with elevated bilirubin   That are currently affecting medical management.   I expect  patient  to be hospitalized for 2 midnights requiring inpatient medical care.  Patient is at high risk for adverse outcome (such as loss of life or disability) if not treated.  Indication for inpatient stay as follows:      severe pain requiring acute inpatient management,  inability to maintain oral hydration    Need for operative/procedural  intervention    Need for  IV fluids,   IV pain medications      Level of care    medical floor        Bohdan Macho 08/18/2018, 1:11 AM    [facility]     after 2 AM please page floor coverage PA If 7AM-7PM,  please contact the day team taking care of the patient using [email or web address]

## 2018-08-18 ENCOUNTER — Encounter (HOSPITAL_COMMUNITY): Payer: Self-pay | Admitting: *Deleted

## 2018-08-18 ENCOUNTER — Inpatient Hospital Stay (HOSPITAL_COMMUNITY): Payer: Medicaid Other

## 2018-08-18 ENCOUNTER — Other Ambulatory Visit: Payer: Self-pay

## 2018-08-18 DIAGNOSIS — E876 Hypokalemia: Secondary | ICD-10-CM | POA: Diagnosis present

## 2018-08-18 DIAGNOSIS — R935 Abnormal findings on diagnostic imaging of other abdominal regions, including retroperitoneum: Secondary | ICD-10-CM

## 2018-08-18 LAB — COMPREHENSIVE METABOLIC PANEL
ALBUMIN: 3.1 g/dL — AB (ref 3.5–5.0)
ALK PHOS: 221 U/L — AB (ref 38–126)
ALT: 304 U/L — AB (ref 0–44)
ALT: 297 U/L — ABNORMAL HIGH (ref 0–44)
AST: 217 U/L — ABNORMAL HIGH (ref 15–41)
AST: 213 U/L — ABNORMAL HIGH (ref 15–41)
Albumin: 3.1 g/dL — ABNORMAL LOW (ref 3.5–5.0)
Alkaline Phosphatase: 233 U/L — ABNORMAL HIGH (ref 38–126)
Anion gap: 5 (ref 5–15)
Anion gap: 9 (ref 5–15)
BUN: 5 mg/dL — AB (ref 6–20)
BUN: <5 mg/dL — ABNORMAL LOW (ref 6–20)
CALCIUM: 8.2 mg/dL — AB (ref 8.9–10.3)
CALCIUM: 8.2 mg/dL — AB (ref 8.9–10.3)
CO2: 23 mmol/L (ref 22–32)
CO2: 19 mmol/L — AB (ref 22–32)
CREATININE: 0.65 mg/dL (ref 0.44–1.00)
Chloride: 110 mmol/L (ref 98–111)
Chloride: 108 mmol/L (ref 98–111)
Creatinine, Ser: 0.59 mg/dL (ref 0.44–1.00)
GFR calc Af Amer: >60 mL/min (ref >60)
GFR calc non Af Amer: >60 mL/min (ref >60)
Glucose, Bld: 91 mg/dL (ref 70–99)
Glucose, Bld: 77 mg/dL (ref 70–99)
Potassium: 3.2 mmol/L — ABNORMAL LOW (ref 3.5–5.1)
Potassium: 3.6 mmol/L (ref 3.5–5.1)
Sodium: 138 mmol/L (ref 135–145)
Sodium: 136 mmol/L (ref 135–145)
Total Bilirubin: 6 mg/dL — ABNORMAL HIGH (ref 0.3–1.2)
Total Bilirubin: 6.4 mg/dL — ABNORMAL HIGH (ref 0.3–1.2)
Total Protein: 6.1 g/dL — ABNORMAL LOW (ref 6.5–8.1)
Total Protein: 6 g/dL — ABNORMAL LOW (ref 6.5–8.1)

## 2018-08-18 LAB — LIPASE, BLOOD
Lipase: 780 U/L — ABNORMAL HIGH (ref 11–51)
Lipase: 820 U/L — ABNORMAL HIGH (ref 11–51)

## 2018-08-18 LAB — LIPID PANEL
CHOL/HDL RATIO: 8 ratio
Cholesterol: 185 mg/dL (ref 0–200)
HDL: 23 mg/dL — ABNORMAL LOW (ref >40)
LDL Cholesterol: 140 mg/dL — ABNORMAL HIGH (ref 0–99)
Triglycerides: 112 mg/dL (ref <150)
VLDL: 22 mg/dL (ref 0–40)

## 2018-08-18 LAB — CBC
HCT: 33 % — ABNORMAL LOW (ref 36.0–46.0)
Hemoglobin: 11.2 g/dL — ABNORMAL LOW (ref 12.0–15.0)
MCH: 29.6 pg (ref 26.0–34.0)
MCHC: 33.9 g/dL (ref 30.0–36.0)
MCV: 87.3 fL (ref 80.0–100.0)
Platelets: 174 10*3/uL (ref 150–400)
RBC: 3.78 MIL/uL — ABNORMAL LOW (ref 3.87–5.11)
RDW: 14.5 % (ref 11.5–15.5)
WBC: 4.5 10*3/uL (ref 4.0–10.5)
nRBC: 0 % (ref 0.0–0.2)

## 2018-08-18 LAB — TSH: TSH: 7.314 u[IU]/mL — ABNORMAL HIGH (ref 0.350–4.500)

## 2018-08-18 LAB — PHOSPHORUS: Phosphorus: 2.6 mg/dL (ref 2.5–4.6)

## 2018-08-18 LAB — PREGNANCY, URINE: Preg Test, Ur: NEGATIVE

## 2018-08-18 LAB — MAGNESIUM: Magnesium: 1.6 mg/dL — ABNORMAL LOW (ref 1.7–2.4)

## 2018-08-18 LAB — HIV ANTIBODY (ROUTINE TESTING W REFLEX): HIV SCREEN 4TH GENERATION: NONREACTIVE

## 2018-08-18 MED ORDER — HEPARIN SODIUM (PORCINE) 5000 UNIT/ML IJ SOLN
5000.0000 [IU] | Freq: Three times a day (TID) | INTRAMUSCULAR | Status: DC
  Administered 2018-08-18: 5000 [IU] via SUBCUTANEOUS
  Filled 2018-08-18: qty 1

## 2018-08-18 MED ORDER — POTASSIUM CHLORIDE 2 MEQ/ML IV SOLN
INTRAVENOUS | Status: DC
  Administered 2018-08-18 – 2018-08-19 (×3): via INTRAVENOUS
  Filled 2018-08-18 (×4): qty 1000

## 2018-08-18 MED ORDER — POTASSIUM CHLORIDE 10 MEQ/100ML IV SOLN
10.0000 meq | INTRAVENOUS | Status: AC
  Administered 2018-08-18 (×4): 10 meq via INTRAVENOUS
  Filled 2018-08-18 (×4): qty 100

## 2018-08-18 MED ORDER — LACTATED RINGERS IV SOLN: INTRAVENOUS | Status: DC

## 2018-08-18 MED ORDER — MAGNESIUM SULFATE 2 GM/50ML IV SOLN
2.0000 g | Freq: Once | INTRAVENOUS | Status: AC
  Administered 2018-08-18: 2 g via INTRAVENOUS
  Filled 2018-08-18: qty 50

## 2018-08-18 NOTE — H&P (View-Only) (Signed)
GI BILIARY WEEKEND COVERAGE  HISTORY OF PRESENT ILLNESS:  Amber Byrd is a 28 y.o. female who presents with acute biliary pancreatitis, who Dr. Matthias Hughs asked me to see regarding possible ERCP for choledocholithiasis.  Clinically stable.  I have reviewed her laboratories and x-rays.  MRCP confirms to small common duct stones.  No complicating features of her pancreatitis.  Sitting comfortably in bed this afternoon.  Pain is improved.  Thirsty but otherwise well.  REVIEW OF SYSTEMS:  All non-GI ROS negative except for back pain  Past Medical History:  Diagnosis Date  . Migraine     Past Surgical History:  Procedure Laterality Date  . ear surgery to repair holes in eardrums      Social History KLAIR URSERY  reports that she has never smoked. She has never used smokeless tobacco. She reports that she does not drink alcohol or use drugs.  family history includes Arthritis in her father; Depression in her father; Diabetes in her mother; Heart disease in her mother.  No Known Allergies     PHYSICAL EXAMINATION: Vital signs: BP (!) 123/59 (BP Location: Right Arm)   Pulse 75   Temp 98.2 F (36.8 C) (Oral)   Resp 18   Ht 5\' 3"  (1.6 m)   Wt 80 kg   SpO2 100%   BMI 31.25 kg/m   Constitutional: generally well-appearing, no acute distress Psychiatric: alert and oriented x3, cooperative Eyes: extraocular movements intact, anicteric, conjunctiva pink Mouth: oral pharynx moist, no lesions Neck: supple no lymphadenopathy Cardiovascular: heart regular rate and rhythm, no murmur Lungs: clear to auscultation bilaterally Abdomen: soft, mild epigastric tenderness, nondistended, no obvious ascites, no peritoneal signs, normal bowel sounds, no organomegaly Rectal: Omitted Extremities: no lower extremity edema bilaterally Skin: no lesions on visible extremities Neuro: No focal deficits.  Cranial nerves intact  ASSESSMENT:  1.  Acute biliary pancreatitis.  Improving without  complicating features 2.  Choledocholithiasis with associated abnormal liver tests 3.  Cholelithiasis   PLAN:  1.  IV hydration 2.  Plan ERCP with sphincterotomy and stone extraction tomorrow.The nature of the procedure, as well as the risks (we discussed in detail pancreatitis, perforation, bleeding, and infection), benefits, and alternatives were carefully and thoroughly reviewed with the patient.  Also provided a drawing of the anatomy and details her particular issue as well as the procedural details.  Ample time for discussion and questions allowed. The patient understood, was satisfied, and agreed to proceed. 3.  Laparoscopic cholecystectomy after common duct cleared the patient has adequately pancreatitis.  May be able to do this admission.  Wilhemina Bonito. Eda Keys., M.D. [facility] Division of Gastroenterology

## 2018-08-18 NOTE — Progress Notes (Signed)
PROGRESS NOTE                                                                                                                                                                                                             Patient Demographics:    Amber Byrd, is a 28 y.o. female, DOB - May 07, 1991, WPY:[id number]  Admit date - 08/17/2018   Admitting Physician Therisa Doyne, MD  Outpatient Primary MD for the patient is Richardean Chimera, MD  LOS - 1  CC - Abd Pain     Brief Narrative  - 28 year old female otherwise healthy with history of mild depression admitted for epigastric abdominal pain initially to [facility] work-up there showed CBD dilation on ultrasound with CBD measuring 13 mm, she had a suspicious 6 mm CBD calculus, she was subsequently transferred here for further care.   Subjective:    Amber Byrd today has, No headache, No chest pain, epigastric abdominal pain - No Nausea, No new weakness tingling or numbness, No Cough - SOB.     Assessment  & Plan :     1.  Choledocholithiasis with gallstone pancreatitis -GI on board, patient to undergo MRCP, likely will require ERCP soon along with cholecystectomy, general surgery is also been consulted, clinically patient is much improved pain has almost completely resolved, she has no fever or leukocytosis, for now holding off antibiotics unless GI feels otherwise.  Continue hydration and bowel rest along with pain control.  Case discussed with general surgery and GI.  2.  Mildly elevated TSH.  Could be sick euthyroid, outpatient recheck TSH in 4 to 6 weeks by PCP.    Family Communication  :  None  Code Status :  Full  Disposition Plan  :  TBD  Consults  :  GI, CCS  Procedures  :    MRCP -     DVT Prophylaxis  : Heparin   Lab Results  Component Value Date   PLT 174 08/18/2018    Diet :  Diet Order            Diet NPO time specified  Diet effective now               Inpatient  Medications Scheduled Meds: Continuous Infusions: . lactated ringers     PRN Meds:.morphine injection, ondansetron **OR** ondansetron (ZOFRAN) IV  Antibiotics  :   Anti-infectives (From admission, onward)   None          Objective:  Vitals:   08/17/18 2241 08/18/18 0509  BP: 122/71 122/75  Pulse: 74 79  Resp: 16 18  Temp: 97.9 F (36.6 C) 98.3 F (36.8 C)  TempSrc: Oral Oral  SpO2: 98% 97%  Weight: 80 kg   Height: 5\' 3"  (1.6 m)     Wt Readings from Last 3 Encounters:  08/17/18 80 kg  07/04/15 71.7 kg  10/06/14 71.7 kg     Intake/Output Summary (Last 24 hours) at 08/18/2018 0929 Last data filed at 08/18/2018 0918 Gross per 24 hour  Intake 992.45 ml  Output 2700 ml  Net -1707.55 ml     Physical Exam  Awake Alert, Oriented X 3, No new F.N deficits, Normal affect, does have mild icterus and mild epigastric abdominal pain. [place].AT,PERRAL Supple Neck,No JVD, No cervical lymphadenopathy appriciated.  Symmetrical Chest wall movement, Good air movement bilaterally, CTAB RRR,No Gallops,Rubs or new Murmurs, No Parasternal Heave +ve B.Sounds, Abd Soft,   No organomegaly appriciated, No rebound - guarding or rigidity. No Cyanosis, Clubbing or edema, No new Rash or bruise       Data Review:    CBC Recent Labs  Lab 08/17/18 2311 08/18/18 0327  WBC 4.4 4.5  HGB 11.1* 11.2*  HCT 32.4* 33.0*  PLT 175 174  MCV 87.1 87.3  MCH 29.8 29.6  MCHC 34.3 33.9  RDW 14.4 14.5  LYMPHSABS 1.2  --   MONOABS 0.4  --   EOSABS 0.1  --   BASOSABS 0.0  --     Chemistries  Recent Labs  Lab 08/17/18 2311 08/18/18 0327  NA 138 136  K 3.2* 3.6  CL 110 108  CO2 23 19*  GLUCOSE 91 77  BUN 5* <5*  CREATININE 0.65 0.59  CALCIUM 8.2* 8.2*  MG  --  1.6*  AST 217* 213*  ALT 304* 297*  ALKPHOS 221* 233*  BILITOT 6.0* 6.4*   ------------------------------------------------------------------------------------------------------------------ Recent Labs    08/18/18 0327   CHOL 185  HDL 23*  LDLCALC 140*  TRIG 112  CHOLHDL 8.0    No results found for: HGBA1C ------------------------------------------------------------------------------------------------------------------ Recent Labs    08/18/18 0327  TSH 7.314*   ------------------------------------------------------------------------------------------------------------------ No results for input(s): VITAMINB12, FOLATE, FERRITIN, TIBC, IRON, RETICCTPCT in the last 72 hours.  Coagulation profile Recent Labs  Lab 08/17/18 2311  INR 1.0    No results for input(s): DDIMER in the last 72 hours.  Cardiac Enzymes No results for input(s): CKMB, TROPONINI, MYOGLOBIN in the last 168 hours.  Invalid input(s): CK ------------------------------------------------------------------------------------------------------------------ No results found for: BNP  Micro Results No results found for this or any previous visit (from the past 240 hour(s)).  Radiology Reports No results found.  Time Spent in minutes  30   Susa Raring M.D on 08/18/2018 at 9:29 AM  To page go to [email or web address] - password [facility]

## 2018-08-18 NOTE — Progress Notes (Signed)
GI BILIARY WEEKEND COVERAGE  HISTORY OF PRESENT ILLNESS:  Amber Byrd is a 27 y.o. female who presents with acute biliary pancreatitis, who Dr. Buccini asked me to see regarding possible ERCP for choledocholithiasis.  Clinically stable.  I have reviewed her laboratories and x-rays.  MRCP confirms to small common duct stones.  No complicating features of her pancreatitis.  Sitting comfortably in bed this afternoon.  Pain is improved.  Thirsty but otherwise well.  REVIEW OF SYSTEMS:  All non-GI ROS negative except for back pain  Past Medical History:  Diagnosis Date  . Migraine     Past Surgical History:  Procedure Laterality Date  . ear surgery to repair holes in eardrums      Social History Amber Byrd  reports that she has never smoked. She has never used smokeless tobacco. She reports that she does not drink alcohol or use drugs.  family history includes Arthritis in her father; Depression in her father; Diabetes in her mother; Heart disease in her mother.  No Known Allergies     PHYSICAL EXAMINATION: Vital signs: BP (!) 123/59 (BP Location: Right Arm)   Pulse 75   Temp 98.2 F (36.8 C) (Oral)   Resp 18   Ht 5' 3" (1.6 m)   Wt 80 kg   SpO2 100%   BMI 31.25 kg/m   Constitutional: generally well-appearing, no acute distress Psychiatric: alert and oriented x3, cooperative Eyes: extraocular movements intact, anicteric, conjunctiva pink Mouth: oral pharynx moist, no lesions Neck: supple no lymphadenopathy Cardiovascular: heart regular rate and rhythm, no murmur Lungs: clear to auscultation bilaterally Abdomen: soft, mild epigastric tenderness, nondistended, no obvious ascites, no peritoneal signs, normal bowel sounds, no organomegaly Rectal: Omitted Extremities: no lower extremity edema bilaterally Skin: no lesions on visible extremities Neuro: No focal deficits.  Cranial nerves intact  ASSESSMENT:  1.  Acute biliary pancreatitis.  Improving without  complicating features 2.  Choledocholithiasis with associated abnormal liver tests 3.  Cholelithiasis   PLAN:  1.  IV hydration 2.  Plan ERCP with sphincterotomy and stone extraction tomorrow.The nature of the procedure, as well as the risks (we discussed in detail pancreatitis, perforation, bleeding, and infection), benefits, and alternatives were carefully and thoroughly reviewed with the patient.  Also provided a drawing of the anatomy and details her particular issue as well as the procedural details.  Ample time for discussion and questions allowed. The patient understood, was satisfied, and agreed to proceed. 3.  Laparoscopic cholecystectomy after common duct cleared the patient has adequately pancreatitis.  May be able to do this admission.   N. , Jr., M.D. [facility] Division of Gastroenterology 

## 2018-08-18 NOTE — Consult Note (Signed)
Referring Provider:  Dr. Angela Nevin Primary Care Physician:  Richardean Chimera, MD Primary Gastroenterologist: None (unassigned)  Reason for Consultation: Gallstone pancreatitis, biliary obstruction  HPI: Amber Byrd is a 28 y.o. female transferred from [facility] in [place] because of the above problems.  She has a one-week history of antecedent abdominal pain and was found on ultrasound, by report, to have a 6 mm stone in the common duct, with biliary dilatation to 13 mm, as well as elevated lipase and liver chemistries.  She has not had a CT scan or an MRCP.    She was transferred here for the purpose of ERCP.  Overnight, her labs have remained about the same, with normal white count and renal function, lipase around 800, transaminases in the 200-300 range, bilirubin 6.4.  She feels okay, somewhat uncomfortable but not in severe pain.    She is in excellent general medical health and has had no previous surgery except a tympanoplasty several years ago.  Past Medical History:  Diagnosis Date  . Migraine     Past Surgical History:  Procedure Laterality Date  . 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 Authorizing Provider  amoxicillin (AMOXIL) 875 MG tablet Take 1 tablet (875 mg total) by mouth 2 (two) times daily. Patient not taking: Reported on 07/11/2014 08/12/13   Deatra Canter, FNP  Carboxymethylcellulose Sodium 0.25 % SOLN Apply 1 drop to eye daily.    [provider]  cycloSPORINE (RESTASIS) 0.05 % ophthalmic emulsion Place 1 drop into both eyes 2 (two) times daily.    [provider]  loratadine (CLARITIN) 10 MG tablet Take 10 mg by mouth daily.    [provider]  metoCLOPramide (REGLAN) 10 MG tablet Take 1 tablet (10 mg total) by mouth every 6 (six) hours as needed for nausea (or headache). 07/04/15   Samuel Jester, DO  naproxen sodium (ANAPROX DS) 550 MG  tablet Take 1 tablet (550 mg total) by mouth 2 (two) times daily with a meal. 10/06/14   Neese, Milton, NP  Olopatadine HCl 0.2 % SOLN Apply 1 drop to eye every morning.    [provider]  Polyvinyl Alcohol-Povidone (REFRESH OP) Apply 1 drop to eye daily with supper.    [provider]  sertraline (ZOLOFT) 50 MG tablet Take 1 tablet (50 mg total) by mouth daily. 08/12/13   Deatra Canter, FNP  topiramate (TOPAMAX) 100 MG tablet Take 100 mg by mouth daily.    [provider]  TRAZODONE HCL PO Take 2 tablets by mouth at bedtime.    [provider]  Vitamin D, Ergocalciferol, (DRISDOL) 50000 UNITS CAPS capsule Take 1 capsule (50,000 Units total) by mouth every 7 (seven) days. Patient not taking: Reported on 07/11/2014 08/13/13   Deatra Canter, FNP    Current Facility-Administered Medications  Medication Dose Route Frequency Provider Last Rate Last Dose  . 0.9 %  sodium chloride infusion   Intravenous Continuous Therisa Doyne, MD 300 mL/hr at 08/18/18 0334    . magnesium sulfate IVPB 2 g 50 mL  2 g Intravenous Once Stevie Kern A, NP 50 mL/hr at 08/18/18 0655 2 g at 08/18/18 0655  . morphine 2 MG/ML injection 1 mg  1 mg Intravenous Q4H PRN Doutova, Anastassia, MD      . ondansetron (ZOFRAN) tablet 4 mg  4 mg Oral Q6H  PRN Therisa Doyne, MD       Or  . ondansetron (ZOFRAN) injection 4 mg  4 mg Intravenous Q6H PRN Therisa Doyne, MD        Allergies as of 08/17/2018  . (No Known Allergies)    Family History  Problem Relation Age of Onset  . Diabetes Mother   . Heart disease Mother   . Depression Father   . Arthritis Father     Social History   Socioeconomic History  . Marital status: Legally Separated    Spouse name: Not on file  . Number of children: Not on file  . Years of education: Not on file  . Highest education level: Not on file  Occupational History  . Not on file  Social Needs  . Financial resource strain:  Not on file  . Food insecurity:    Worry: Not on file    Inability: Not on file  . Transportation needs:    Medical: Not on file    Non-medical: Not on file  Tobacco Use  . Smoking status: Never Smoker  . Smokeless tobacco: Never Used  Substance and Sexual Activity  . Alcohol use: No  . Drug use: No  . Sexual activity: Yes    Birth control/protection: None  Lifestyle  . Physical activity:    Days per week: Not on file    Minutes per session: Not on file  . Stress: Not on file  Relationships  . Social connections:    Talks on phone: Not on file    Gets together: Not on file    Attends religious service: Not on file    Active member of club or organization: Not on file    Attends meetings of clubs or organizations: Not on file    Relationship status: Not on file  . Intimate partner violence:    Fear of current or ex partner: Not on file    Emotionally abused: Not on file    Physically abused: Not on file    Forced sexual activity: Not on file  Other Topics Concern  . Not on file  Social History Narrative  . Not on file    Review of Systems: See HPI  Physical Exam: Vital signs in last 24 hours: Temp:  [97.9 F (36.6 C)-98.3 F (36.8 C)] 98.3 F (36.8 C) (03/07 0509) Pulse Rate:  [74-79] 79 (03/07 0509) Resp:  [16-18] 18 (03/07 0509) BP: (122)/(71-75) 122/75 (03/07 0509) SpO2:  [97 %-98 %] 97 % (03/07 0509) Weight:  [80 kg] 80 kg (03/06 2241) Last BM Date: (PTA) General:   Alert,  Well-developed, well-nourished, pleasant and cooperative in NAD.  Mildly jaundiced. Head:  Normocephalic and atraumatic. Eyes: Slightly icteric sclerae.  Conjunctiva pink. Lungs:  Clear throughout to auscultation.   No wheezes, crackles, or rhonchi. No evident respiratory distress. Heart:   Regular rate and rhythm; no murmurs, clicks, rubs,  or gallops. Abdomen:   Mild to moderate upper abdominal tenderness without exquisite tenderness, peritoneal findings, or evident mass-effect. Msk:    Symmetrical without gross deformities. Pulses:  Normal radial pulse is noted. Neurologic:  Alert and coherent;  grossly normal neurologically. Skin:  Intact without significant lesions or rashes. Psych:   Alert and cooperative. Normal mood and affect.  Intake/Output from previous day: 03/06 0701 - 03/07 0700 In: 992.5 [I.V.:854.7; IV Piggyback:137.7] Out: 1400 [Urine:1400] Intake/Output this shift: No intake/output data recorded.  Lab Results: Recent Labs    08/17/18 2311 08/18/18 0327  WBC  4.4 4.5  HGB 11.1* 11.2*  HCT 32.4* 33.0*  PLT 175 174   BMET Recent Labs    08/17/18 2311 08/18/18 0327  NA 138 136  K 3.2* 3.6  CL 110 108  CO2 23 19*  GLUCOSE 91 77  BUN 5* <5*  CREATININE 0.65 0.59  CALCIUM 8.2* 8.2*   LFT Recent Labs    08/18/18 0327  PROT 6.0*  ALBUMIN 3.1*  AST 213*  ALT 297*  ALKPHOS 233*  BILITOT 6.4*   PT/INR Recent Labs    08/17/18 2311  LABPROT 13.4  INR 1.0    Studies/Results: No results found.  Impression: 1.  Choledocholithiasis with elevated liver chemistries but no evidence of septic cholangitis 2.  Gallstone pancreatitis, clinically mild to moderate in severity  Plan: I will discussed the case with Dr. Yancey Flemings, who is providing biliary backup at the hospital this weekend.  I believe this patient will need an ERCP, so I discussed the nature, purpose, and risks of that procedure with the patient.  Risks discussed include a chance of worsening her pancreatitis, as well as bleeding, perforation, infection, and anesthesia risk.  I think the main question is whether this patient should have further imaging studies at this facility prior to embarking on ERCP, and I will leave that to the discretion of Dr. Marina Goodell.   LOS: 1 day   Katy Fitch Keyonia Gluth  08/18/2018, 7:34 AM   Pager [phone] If no answer or after 5 PM call [phone]

## 2018-08-18 NOTE — Consult Note (Signed)
[facility] Surgery Consult/Admission Note  Amber Byrd 02/12/1991  [id number].    Requesting MD: Dr. Thedore Mins Chief Complaint/Reason for Consult: gallstone pancreatitis  HPI:   Pt is a 28 y.o. female transferred from [facility] in [place] due to gallstone pancreatitis and biliary obstruction. Workup there showed CBD dilation on ultrasound with CBD measuring 13 mm, she had a suspicious 6 mm CBD calculus. She is otherwise healthy with a hx of migraines and tubal ligation. She had one week of upper abdominal pain, nausea, nonbilious vomiting, and constipation. No urinary issues or fever. Currently she is still having constant epigastric pain radiating into her back. No nausea or vomiting overnight. She is not on anticoagulation.    ROS:  Review of Systems  Constitutional: Negative for chills, diaphoresis and fever.  HENT: Negative for sore throat.   Respiratory: Negative for cough and shortness of breath.   Cardiovascular: Negative for chest pain.  Gastrointestinal: Positive for abdominal pain, constipation, nausea and vomiting. Negative for blood in stool and diarrhea.  Genitourinary: Negative for dysuria.  Skin: Negative for rash.  Neurological: Negative for dizziness and loss of consciousness.  All other systems reviewed and are negative.    Family History  Problem Relation Age of Onset  . Diabetes Mother   . Heart disease Mother   . Depression Father   . Arthritis Father     Past Medical History:  Diagnosis Date  . Migraine     Past Surgical History:  Procedure Laterality Date  . 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 She has never used smokeless tobacco. She reports that she does not drink alcohol or use drugs.  Allergies: No Known Allergies  Medications Prior to Admission  Medication Sig Dispense Refill  . Olopatadine HCl 0.2 % SOLN Place 1 drop into both eyes every morning.     . sertraline  (ZOLOFT) 50 MG tablet Take 1 tablet (50 mg total) by mouth daily. (Patient taking differently: Take 100 mg by mouth daily. ) 30 tablet 11  . amoxicillin (AMOXIL) 875 MG tablet Take 1 tablet (875 mg total) by mouth 2 (two) times daily. (Patient not taking: Reported on 07/11/2014) 28 tablet 0  . metoCLOPramide (REGLAN) 10 MG tablet Take 1 tablet (10 mg total) by mouth every 6 (six) hours as needed for nausea (or headache). (Patient not taking: Reported on 08/18/2018) 6 tablet 0  . naproxen sodium (ANAPROX DS) 550 MG tablet Take 1 tablet (550 mg total) by mouth 2 (two) times daily with a meal. (Patient not taking: Reported on 08/18/2018) 15 tablet 0  . Vitamin D, Ergocalciferol, (DRISDOL) 50000 UNITS CAPS capsule Take 1 capsule (50,000 Units total) by mouth every 7 (seven) days. (Patient not taking: Reported on 07/11/2014) 18 capsule 0    Blood pressure 122/75, pulse 79, temperature 98.3 F (36.8 C), temperature source Oral, resp. rate 18, height  (1.6 m), weight 80 kg, SpO2 97 %.  Physical Exam Vitals signs and nursing note reviewed.  Constitutional:      General: She is not in acute distress.    Appearance: She is not diaphoretic.  HENT:     Head: Normocephalic and atraumatic.     Nose: Nose normal.     Mouth/Throat:     Lips: Pink.     Mouth: Mucous membranes are moist.     Pharynx: Oropharynx is clear.  Eyes:     General:  Scleral icterus (mild) present.        Right eye: No discharge.        Left eye: No discharge.     Conjunctiva/sclera: Conjunctivae normal.     Pupils: Pupils are equal, round, and reactive to light.  Neck:     Musculoskeletal: Normal range of motion and neck supple.  Cardiovascular:     Rate and Rhythm: Normal rate and regular rhythm.     Pulses:          Radial pulses are 2+ on the right side and 2+ on the left side.     Heart sounds: Normal heart sounds. No murmur. No friction rub. No gallop.   Pulmonary:     Effort: Pulmonary effort is normal. No respiratory  distress.     Breath sounds: Normal breath sounds. No decreased breath sounds, wheezing, rhonchi or rales.  Abdominal:     General: Bowel sounds are normal. There is no distension.     Palpations: Abdomen is soft. There is no mass.     Tenderness: There is abdominal tenderness in the right upper quadrant and epigastric area. There is guarding. There is no rebound.  Musculoskeletal: Normal range of motion.        General: No deformity.  Skin:    General: Skin is warm and dry.     Findings: No rash.  Neurological:     Mental Status: She is alert and oriented to person, place, and time.  Psychiatric:        Mood and Affect: Mood normal.        Behavior: Behavior normal.     Results for orders placed or performed during the hospital encounter of 08/17/18 (from the past 48 hour(s))  Comprehensive metabolic panel     Status: Abnormal   Collection Time: 08/17/18 11:11 PM  Result Value Ref Range   Sodium 138 135 - 145 mmol/L   Potassium 3.2 (L) 3.5 - 5.1 mmol/L   Chloride 110 98 - 111 mmol/L   CO2 23 22 - 32 mmol/L   Glucose, Bld 91 70 - 99 mg/dL   BUN 5 (L) 6 - 20 mg/dL   Creatinine, Ser 0.98 0.44 - 1.00 mg/dL   Calcium 8.2 (L) 8.9 - 10.3 mg/dL   Total Protein 6.1 (L) 6.5 - 8.1 g/dL   Albumin 3.1 (L) 3.5 - 5.0 g/dL   AST 119 (H) 15 - 41 U/L   ALT 304 (H) 0 - 44 U/L   Alkaline Phosphatase 221 (H) 38 - 126 U/L   Total Bilirubin 6.0 (H) 0.3 - 1.2 mg/dL   GFR calc non Af Amer >60 >60 mL/min   GFR calc Af Amer >60 >60 mL/min   Anion gap 5 5 - 15    Comment: Performed at [facility], [address], [place], [place] [address]  Protime-INR     Status: None   Collection Time: 08/17/18 11:11 PM  Result Value Ref Range   Prothrombin Time 13.4 11.4 - 15.2 seconds   INR 1.0 0.8 - 1.2    Comment: (NOTE) INR goal varies based on device and disease states. Performed at [facility], [address], [place], [place] [address]   CBC with Differential/Platelet     Status:  Abnormal   Collection Time: 08/17/18 11:11 PM  Result Value Ref Range   WBC 4.4 4.0 - 10.5 K/uL   RBC 3.72 (L) 3.87 - 5.11 MIL/uL   Hemoglobin 11.1 (L)  12.0 - 15.0 g/dL   HCT 38.1 (L) 82.9 - 93.7 %   MCV 87.1 80.0 - 100.0 fL   MCH 29.8 26.0 - 34.0 pg   MCHC 34.3 30.0 - 36.0 g/dL   RDW 16.9 67.8 - 93.8 %   Platelets 175 150 - 400 K/uL   nRBC 0.0 0.0 - 0.2 %   Neutrophils Relative % 62 %   Neutro Abs 2.7 1.7 - 7.7 K/uL   Lymphocytes Relative 27 %   Lymphs Abs 1.2 0.7 - 4.0 K/uL   Monocytes Relative 9 %   Monocytes Absolute 0.4 0.1 - 1.0 K/uL   Eosinophils Relative 1 %   Eosinophils Absolute 0.1 0.0 - 0.5 K/uL   Basophils Relative 1 %   Basophils Absolute 0.0 0.0 - 0.1 K/uL   Immature Granulocytes 0 %   Abs Immature Granulocytes 0.01 0.00 - 0.07 K/uL    Comment: Performed at [facility], [address] [place], [place] [address]  Lipase, blood     Status: Abnormal   Collection Time: 08/17/18 11:11 PM  Result Value Ref Range   Lipase 780 (H) 11 - 51 U/L    Comment: RESULTS CONFIRMED BY MANUAL DILUTION Performed at [facility], [address], [place], [place] [address]   Pregnancy, urine     Status: None   Collection Time: 08/18/18  1:02 AM  Result Value Ref Range   Preg Test, Ur NEGATIVE NEGATIVE    Comment:        THE SENSITIVITY OF THIS METHODOLOGY IS >20 mIU/mL. Performed at [facility], [address] [place], [place] [address]   Magnesium     Status: Abnormal   Collection Time: 08/18/18  3:27 AM  Result Value Ref Range   Magnesium 1.6 (L) 1.7 - 2.4 mg/dL    Comment: Performed at [facility], [address], [place], [place] [address]  Phosphorus     Status: None   Collection Time: 08/18/18  3:27 AM  Result Value Ref Range   Phosphorus 2.6 2.5 - 4.6 mg/dL    Comment: Performed at [facility], [address], [place], [place] [address]  TSH     Status: Abnormal   Collection Time: 08/18/18  3:27 AM  Result Value Ref Range    TSH 7.314 (H) 0.350 - 4.500 uIU/mL    Comment: Performed by a 3rd Generation assay with a functional sensitivity of <=0.01 uIU/mL. Performed at [facility], [address], [place], [place] [address]   Comprehensive metabolic panel     Status: Abnormal   Collection Time: 08/18/18  3:27 AM  Result Value Ref Range   Sodium 136 135 - 145 mmol/L   Potassium 3.6 3.5 - 5.1 mmol/L   Chloride 108 98 - 111 mmol/L   CO2 19 (L) 22 - 32 mmol/L   Glucose, Bld 77 70 - 99 mg/dL   BUN <5 (L) 6 - 20 mg/dL   Creatinine, Ser 0.86 0.44 - 1.00 mg/dL   Calcium 8.2 (L) 8.9 - 10.3 mg/dL   Total Protein 6.0 (L) 6.5 - 8.1 g/dL   Albumin 3.1 (L) 3.5 - 5.0 g/dL   AST 761 (H) 15 - 41 U/L   ALT 297 (H) 0 - 44 U/L   Alkaline Phosphatase 233 (H) 38 - 126 U/L   Total Bilirubin 6.4 (H) 0.3 - 1.2 mg/dL   GFR calc non Af Amer >60 >60 mL/min  GFR calc Af Amer >60 >60 mL/min   Anion gap 9 5 - 15    Comment: Performed at [facility], [address], [place], [place] [address]  CBC     Status: Abnormal   Collection Time: 08/18/18  3:27 AM  Result Value Ref Range   WBC 4.5 4.0 - 10.5 K/uL   RBC 3.78 (L) 3.87 - 5.11 MIL/uL   Hemoglobin 11.2 (L) 12.0 - 15.0 g/dL   HCT 42.8 (L) 76.8 - 11.5 %   MCV 87.3 80.0 - 100.0 fL   MCH 29.6 26.0 - 34.0 pg   MCHC 33.9 30.0 - 36.0 g/dL   RDW 72.6 20.3 - 55.9 %   Platelets 174 150 - 400 K/uL   nRBC 0.0 0.0 - 0.2 %    Comment: Performed at [facility], [address], [place], [place] [address]  Lipid panel     Status: Abnormal   Collection Time: 08/18/18  3:27 AM  Result Value Ref Range   Cholesterol 185 0 - 200 mg/dL   Triglycerides 845 <364 mg/dL   HDL 23 (L) >68 mg/dL   Total CHOL/HDL Ratio 8.0 RATIO   VLDL 22 0 - 40 mg/dL   LDL Cholesterol 032 (H) 0 - 99 mg/dL    Comment:        Total Cholesterol/HDL:CHD Risk Coronary Heart Disease Risk Table                     Men   Women  1/2 Average Risk   3.4   3.3  Average Risk       5.0   4.4  2 X Average  Risk   9.6   7.1  3 X Average Risk  23.4   11.0        Use the calculated Patient Ratio above and the CHD Risk Table to determine the patient's CHD Risk.        ATP III CLASSIFICATION (LDL):  <100     mg/dL   Optimal  122-482  mg/dL   Near or Above                    Optimal  130-159  mg/dL   Borderline  500-370  mg/dL   High  >488     mg/dL   Very High Performed at [facility], [address], [place], [place] [address]   Lipase, blood     Status: Abnormal   Collection Time: 08/18/18  3:27 AM  Result Value Ref Range   Lipase 820 (H) 11 - 51 U/L    Comment: RESULTS CONFIRMED BY MANUAL DILUTION Performed at [facility], [address] [place], [place] [address]    No results found.    Assessment/Plan Active Problems:   Elevated LFTs   Pancreatitis   Biliary obstruction   Hypokalemia  Gallstone pancreatitis - GI planning MRCP today - Lipase 820, AST 213, ALT 297, Tbili 6.4, WBC 4.5 - will await GI's plan if CBD stone - we will plan lap chole when pancreatitis improves  FEN: NPO VTE: SCD's, heparin ID: none Foley: none Follow up: TBD  Thank you for the consult.    Jerre Simon, [facility] Surgery 08/18/2018, 10:20 AM Pager: [phone] Consults: [phone] Mon-Fri 7:00 am-4:30 pm Sat-Sun 7:00 am-11:30 am

## 2018-08-18 NOTE — Progress Notes (Signed)
Pt Magnesium 1.6. NP on call notified.

## 2018-08-18 NOTE — Progress Notes (Signed)
Discussed with Dr. Marina Goodell, who suggests abdominal MRI/MRCP.  Have notified patient of this plan.  Florencia Reasons, M.D. Pager [phone] If no answer or after 5 PM call [phone]

## 2018-08-18 NOTE — Progress Notes (Signed)
MRI positive for choledocholithiasis and uncomplicated pancreatitis.  Advised pt that it would be up to Dr Marina Goodell, but she would most likely getting an ERCP tomorrow.   Florencia Reasons, M.D. Pager [phone] If no answer or after 5 PM call [phone]

## 2018-08-19 ENCOUNTER — Encounter (HOSPITAL_COMMUNITY): Payer: Self-pay

## 2018-08-19 ENCOUNTER — Inpatient Hospital Stay (HOSPITAL_COMMUNITY): Payer: Medicaid Other | Admitting: Certified Registered Nurse Anesthetist

## 2018-08-19 ENCOUNTER — Encounter (HOSPITAL_COMMUNITY)
Admission: AD | Disposition: A | Discharge status: Home / Self Care | Payer: Self-pay | Source: Other Acute Inpatient Hospital | Attending: Internal Medicine

## 2018-08-19 ENCOUNTER — Inpatient Hospital Stay (HOSPITAL_COMMUNITY): Payer: Medicaid Other

## 2018-08-19 DIAGNOSIS — R935 Abnormal findings on diagnostic imaging of other abdominal regions, including retroperitoneum: Secondary | ICD-10-CM

## 2018-08-19 DIAGNOSIS — K8051 Calculus of bile duct without cholangitis or cholecystitis with obstruction: Secondary | ICD-10-CM

## 2018-08-19 DIAGNOSIS — K8050 Calculus of bile duct without cholangitis or cholecystitis without obstruction: Secondary | ICD-10-CM

## 2018-08-19 HISTORY — PX: REMOVAL OF STONES: SHX5545

## 2018-08-19 HISTORY — PX: ERCP: SHX5425

## 2018-08-19 HISTORY — PX: SPHINCTEROTOMY: SHX5544

## 2018-08-19 LAB — HEPATITIS PANEL, ACUTE
HCV Ab: <0.1 s/co ratio (ref 0.0–0.9)
Hep A IgM: NEGATIVE
Hep B C IgM: NEGATIVE
Hepatitis B Surface Ag: NEGATIVE

## 2018-08-19 LAB — COMPREHENSIVE METABOLIC PANEL
ALT: 315 U/L — ABNORMAL HIGH (ref 0–44)
AST: 230 U/L — ABNORMAL HIGH (ref 15–41)
Albumin: 3.3 g/dL — ABNORMAL LOW (ref 3.5–5.0)
Alkaline Phosphatase: 333 U/L — ABNORMAL HIGH (ref 38–126)
Anion gap: 12 (ref 5–15)
CO2: 18 mmol/L — ABNORMAL LOW (ref 22–32)
Calcium: 9.1 mg/dL (ref 8.9–10.3)
Chloride: 106 mmol/L (ref 98–111)
Creatinine, Ser: 0.65 mg/dL (ref 0.44–1.00)
GFR calc Af Amer: >60 mL/min (ref >60)
GFR calc non Af Amer: >60 mL/min (ref >60)
Glucose, Bld: 78 mg/dL (ref 70–99)
Potassium: 4.1 mmol/L (ref 3.5–5.1)
Sodium: 136 mmol/L (ref 135–145)
Total Bilirubin: 8.6 mg/dL — ABNORMAL HIGH (ref 0.3–1.2)
Total Protein: 6.6 g/dL (ref 6.5–8.1)

## 2018-08-19 LAB — PROTIME-INR
INR: 1 (ref 0.8–1.2)
Prothrombin Time: 13.3 seconds (ref 11.4–15.2)

## 2018-08-19 LAB — CBC
HCT: 36.3 % (ref 36.0–46.0)
Hemoglobin: 12.1 g/dL (ref 12.0–15.0)
MCH: 28.9 pg (ref 26.0–34.0)
MCHC: 33.3 g/dL (ref 30.0–36.0)
MCV: 86.8 fL (ref 80.0–100.0)
Platelets: 201 10*3/uL (ref 150–400)
RBC: 4.18 MIL/uL (ref 3.87–5.11)
RDW: 14.8 % (ref 11.5–15.5)
WBC: 4.1 10*3/uL (ref 4.0–10.5)
nRBC: 0 % (ref 0.0–0.2)

## 2018-08-19 LAB — MAGNESIUM: Magnesium: 1.8 mg/dL (ref 1.7–2.4)

## 2018-08-19 SURGERY — ERCP, WITH INTERVENTION IF INDICATED
Anesthesia: General

## 2018-08-19 MED ORDER — MIDAZOLAM HCL 2 MG/2ML IJ SOLN
INTRAMUSCULAR | Status: DC | PRN
  Administered 2018-08-19: 2 mg via INTRAVENOUS

## 2018-08-19 MED ORDER — SODIUM CHLORIDE 0.9 % IV SOLN: INTRAVENOUS | Status: DC

## 2018-08-19 MED ORDER — HYDROMORPHONE HCL 1 MG/ML IJ SOLN: 0.2500 mg | INTRAMUSCULAR | Status: DC | PRN

## 2018-08-19 MED ORDER — GLUCAGON HCL RDNA (DIAGNOSTIC) 1 MG IJ SOLR
INTRAMUSCULAR | Status: AC
  Filled 2018-08-19: qty 1

## 2018-08-19 MED ORDER — ONDANSETRON HCL 4 MG/2ML IJ SOLN
INTRAMUSCULAR | Status: DC | PRN
  Administered 2018-08-19: 4 mg via INTRAVENOUS

## 2018-08-19 MED ORDER — CIPROFLOXACIN IN D5W 400 MG/200ML IV SOLN
INTRAVENOUS | Status: AC
  Filled 2018-08-19: qty 200

## 2018-08-19 MED ORDER — POTASSIUM CHLORIDE 2 MEQ/ML IV SOLN
INTRAVENOUS | Status: DC
  Administered 2018-08-19 – 2018-08-20 (×3): via INTRAVENOUS
  Filled 2018-08-19 (×3): qty 1000

## 2018-08-19 MED ORDER — INDOMETHACIN 50 MG RE SUPP
RECTAL | Status: AC
  Filled 2018-08-19: qty 2

## 2018-08-19 MED ORDER — LACTATED RINGERS IV SOLN
INTRAVENOUS | Status: DC
  Administered 2018-08-19: 1000 mL via INTRAVENOUS

## 2018-08-19 MED ORDER — IOPAMIDOL (ISOVUE-300) INJECTION 61%
INTRAVENOUS | Status: AC
  Filled 2018-08-19: qty 50

## 2018-08-19 MED ORDER — FENTANYL CITRATE (PF) 250 MCG/5ML IJ SOLN
INTRAMUSCULAR | Status: DC | PRN
  Administered 2018-08-19: 100 ug via INTRAVENOUS
  Administered 2018-08-19: 50 ug via INTRAVENOUS

## 2018-08-19 MED ORDER — LACTATED RINGERS IV SOLN
INTRAVENOUS | Status: DC | PRN
  Administered 2018-08-19: 10:00:00 via INTRAVENOUS

## 2018-08-19 MED ORDER — PANTOPRAZOLE SODIUM 40 MG PO TBEC
40.0000 mg | DELAYED_RELEASE_TABLET | Freq: Every day | ORAL | Status: DC
  Administered 2018-08-19 – 2018-08-21 (×2): 40 mg via ORAL
  Filled 2018-08-19 (×2): qty 1

## 2018-08-19 MED ORDER — INDOMETHACIN 50 MG RE SUPP: 100.0000 mg | Freq: Once | RECTAL | Status: DC

## 2018-08-19 MED ORDER — PROMETHAZINE HCL 25 MG/ML IJ SOLN: 6.2500 mg | INTRAMUSCULAR | Status: DC | PRN

## 2018-08-19 MED ORDER — KETOROLAC TROMETHAMINE 30 MG/ML IJ SOLN: 30.0000 mg | Freq: Once | INTRAMUSCULAR | Status: DC | PRN

## 2018-08-19 MED ORDER — OXYCODONE HCL 5 MG PO TABS: 5.0000 mg | ORAL_TABLET | Freq: Once | ORAL | Status: DC | PRN

## 2018-08-19 MED ORDER — MAGNESIUM SULFATE 2 GM/50ML IV SOLN
2.0000 g | Freq: Once | INTRAVENOUS | Status: AC
  Administered 2018-08-19: 2 g via INTRAVENOUS
  Filled 2018-08-19: qty 50

## 2018-08-19 MED ORDER — LIDOCAINE 2% (20 MG/ML) 5 ML SYRINGE
INTRAMUSCULAR | Status: DC | PRN
  Administered 2018-08-19: 80 mg via INTRAVENOUS

## 2018-08-19 MED ORDER — DEXAMETHASONE SODIUM PHOSPHATE 10 MG/ML IJ SOLN
INTRAMUSCULAR | Status: DC | PRN
  Administered 2018-08-19: 4 mg via INTRAVENOUS

## 2018-08-19 MED ORDER — PROPOFOL 10 MG/ML IV BOLUS
INTRAVENOUS | Status: DC | PRN
  Administered 2018-08-19: 150 mg via INTRAVENOUS
  Administered 2018-08-19: 50 mg via INTRAVENOUS

## 2018-08-19 MED ORDER — OXYCODONE HCL 5 MG/5ML PO SOLN: 5.0000 mg | Freq: Once | ORAL | Status: DC | PRN

## 2018-08-19 MED ORDER — SUCCINYLCHOLINE CHLORIDE 200 MG/10ML IV SOSY
PREFILLED_SYRINGE | INTRAVENOUS | Status: DC | PRN
  Administered 2018-08-19: 60 mg via INTRAVENOUS

## 2018-08-19 MED ORDER — INDOMETHACIN 50 MG RE SUPP
RECTAL | Status: DC | PRN
  Administered 2018-08-19: 100 mg via RECTAL

## 2018-08-19 MED ORDER — CIPROFLOXACIN IN D5W 400 MG/200ML IV SOLN: 400.0000 mg | Freq: Once | INTRAVENOUS | Status: DC

## 2018-08-19 MED ORDER — SODIUM CHLORIDE 0.9 % IV SOLN
INTRAVENOUS | Status: DC | PRN
  Administered 2018-08-19: 25 mL

## 2018-08-19 NOTE — Anesthesia Procedure Notes (Signed)
Procedure Name: Intubation Date/Time: 08/19/2018 10:30 AM Performed by: Inda Coke, CRNA Pre-anesthesia Checklist: Patient identified, Emergency Drugs available, Suction available and Patient being monitored Patient Re-evaluated:Patient Re-evaluated prior to induction Oxygen Delivery Method: Circle System Utilized Preoxygenation: Pre-oxygenation with 100% oxygen Induction Type: IV induction Ventilation: Mask ventilation without difficulty Laryngoscope Size: Mac and 3 Grade View: Grade I Tube type: Oral Tube size: 7.0 mm Number of attempts: 1 Airway Equipment and Method: Stylet and Oral airway Placement Confirmation: ETT inserted through vocal cords under direct vision,  positive ETCO2 and breath sounds checked- equal and bilateral Secured at: 22 cm Tube secured with: Tape Dental Injury: Teeth and Oropharynx as per pre-operative assessment

## 2018-08-19 NOTE — Op Note (Signed)
[facility] Patient Name: Amber Byrd Procedure Date : 08/19/2018 MRN: [id number] Attending MD: Wilhemina Bonito. Marina Goodell , MD Date of Birth: 10/21/1990 CSN: [id number] Age: 28 Admit Type: Inpatient Procedure:                ERCP with biliary sphincterotomy and stone                            extraction Indications:              Bile duct stone on Computed Tomogram Scan Providers:                Wilhemina Bonito. Marina Goodell, MD, Margaree Mackintosh, RN,                            Lawson Radar, Technician, Little Ishikawa, CRNA Referring MD:             [facility] hospital[facility] Medicines:                General Anesthesia Complications:            No immediate complications. Estimated Blood Loss:     Estimated blood loss: none. Procedure:                Pre-Anesthesia Assessment:                           - Prior to the procedure, a History and Physical                            was performed, and patient medications and                            allergies were reviewed. The patient is competent.                            The risks and benefits of the procedure and the                            sedation options and risks were discussed with the                            patient. All questions were answered and informed                            consent was obtained. Patient identification and                            proposed procedure were verified by the physician.                            Mental Status Examination: alert and oriented.                            Airway Examination: normal oropharyngeal airway and  neck mobility. Respiratory Examination: clear to                            auscultation. CV Examination: normal. Prophylactic                            Antibiotics: The patient does not require                            prophylactic antibiotics. Prior Anticoagulants: The                            patient has taken no previous anticoagulant or                   antiplatelet agents. ASA Grade Assessment: I - A                            normal, healthy patient. After reviewing the risks                            and benefits, the patient was deemed in                            satisfactory condition to undergo the procedure.                            The anesthesia plan was to use moderate sedation /                            analgesia (conscious sedation). Immediately prior                            to administration of medications, the patient was                            re-assessed for adequacy to receive sedatives. The                            heart rate, respiratory rate, oxygen saturations,                            blood pressure, adequacy of pulmonary ventilation,                            and response to care were monitored throughout the                            procedure. The physical status of the patient was                            re-assessed after the procedure.                           After obtaining informed consent, the scope was  passed under direct vision. Throughout the                            procedure, the patient's blood pressure, pulse, and                            oxygen saturations were monitored continuously. The                            [id number]-Q180V ([id number] Olympus duodenoscope was                            introduced through the mouth, and used to inject                            contrast into and used to inject contrast into the                            bile duct. The ERCP was accomplished without                            difficulty. The patient tolerated the procedure                            well. Scope In: Scope Out: Findings:      The esophagus was successfully intubated under direct vision. The scope       was advanced to a normal major papilla in the descending duodenum       without detailed examination of the pharynx, larynx and  associated       structures, and upper GI tract. The upper GI tract was remarkable for       significant EROSIVE CHANGE IN THE DUODENUM. There was no manipulation or       injection of the pancreatic duct. The bile duct was deeply cannulated.       Contrast was injected. I personally interpreted the bile duct images.       Contrast extended to the entire biliary tree. Opacification of the       entire biliary tree except for the cystic duct and gallbladder was       successful. The in the biliary system was moderately dilated, with a       stone causing an obstruction. The largest diameter was 10 mm. A 0.035       inch straight standard wire was passed into the biliary tree. Biliary       sphincterotomy was made with a traction (standard) sphincterotome using       ERBE electrocautery. There was no post-sphincterotomy bleeding. The       biliary tree was swept with a 12 mm balloon starting at the bifurcation.       All stones were removed. No stones remained. Impression:               1. Choledocholithiasis status post ERC with biliary                            sphincterotomy and stone extraction  2. Cholelithiasis                           3. Recent bout of gallstone pancreatitis (mild).                           4. Erosive duodenitis Recommendation:           1. Standard post ERCP observation including                            indomethacin rectal suppository                           2. Check H. pylori antibody                           3. Initiate pantoprazole 40 mg daily for 6 weeks                           4. [facility] to resume patient care including                            standard follow-up tomorrow. Case findings                            discussed with Dr. Matthias Hughs                           5. General surgery to determine the timing of                            laparoscopic cholecystectomy. Procedure Code(s):        --- Professional ---                            [phone], Endoscopic retrograde                            cholangiopancreatography (ERCP); with removal of                            calculi/debris from biliary/pancreatic duct(s)                           43262, Endoscopic retrograde                            cholangiopancreatography (ERCP); with                            sphincterotomy/papillotomy Diagnosis Code(s):        --- Professional ---                           K80.51, Calculus of bile duct without cholangitis  or cholecystitis with obstruction CPT copyright 2018 [employer] Association. All rights reserved. The codes documented in this report are preliminary and upon coder review may  be revised to meet current compliance requirements. Wilhemina BonitoJohn N. Marina GoodellPerry, MD 08/19/2018 11:31:03 AM This report has been signed electronically. Number of Addenda: 0

## 2018-08-19 NOTE — Progress Notes (Addendum)
PROGRESS NOTE                                                                                                                                                                                                             Patient Demographics:    Amber Byrd, is a 28 y.o. female, DOB - 10-18-1990, JFH:[id number]  Admit date - 08/17/2018   Admitting Physician Therisa Doyne, MD  Outpatient Primary MD for the patient is Richardean Chimera, MD  LOS - 2  CC - Abd Pain     Brief Narrative  - 28 year old female otherwise healthy with history of mild depression admitted for epigastric abdominal pain initially to [facility] work-up there showed CBD dilation on ultrasound with CBD measuring 13 mm, she had a suspicious 6 mm CBD calculus, she was subsequently transferred here for further care.   Subjective:   Patient in bed, appears comfortable, denies any headache, no fever, no chest pain or pressure, no shortness of breath , mild epigastric abdominal pain. No focal weakness.    Assessment  & Plan :     1.  Choledocholithiasis with gallstone pancreatitis - GI along with general surgery on board, being treated conservatively with bowel rest and IV fluids along with pain control, clinically stable, MRCP confirms CBD stone and obstruction.  Going for ERCP on 08/19/2018.  Continue to monitor.  For now no antibiotics.  2.  Mildly elevated TSH.  Could be sick euthyroid, outpatient recheck TSH in 4 to 6 weeks by PCP.  3.  Hypomagnesemia.  Replaced will recheck in the morning.    Family Communication  :  None  Code Status :  Full  Disposition Plan  :  TBD  Consults  :  GI, CCS  Procedures  :    MRCP -     DVT Prophylaxis  : Heparin   Lab Results  Component Value Date   PLT 174 08/18/2018    Diet :  Diet Order            Diet NPO time specified Except for: Ice Chips, Sips with Meds  Diet effective now               Inpatient Medications Scheduled  Meds: Continuous Infusions: . lactated ringers with kcl 125 mL/hr at 08/19/18 0504   PRN Meds:.morphine injection, ondansetron **OR** ondansetron (ZOFRAN) IV  Antibiotics  :   Anti-infectives (From admission, onward)   None  Objective:   Vitals:   08/18/18 0509 08/18/18 1448 08/18/18 2146 08/19/18 0440  BP: 122/75 (!) 123/59 140/82 (!) 141/89  Pulse: 79 75 69 75  Resp: Temp: 98.3 F (36.8 C) 98.2 F (36.8 C) 98.4 F (36.9 C) 98.2 F (36.8 C)  TempSrc: Oral Oral Oral Oral  SpO2: 97% 100% 99% 98%  Weight:      Height:        Wt Readings from Last 3 Encounters:  08/17/18 80 kg  07/04/15 71.7 kg  10/06/14 71.7 kg     Intake/Output Summary (Last 24 hours) at 08/19/2018 0902 Last data filed at 08/19/2018 0442 Gross per 24 hour  Intake 1711.61 ml  Output 1000 ml  Net 711.61 ml     Physical Exam  Awake Alert, Oriented X 3, No new F.N deficits, Normal affect, does have mild icterus and mild epigastric abdominal pain. [place].AT,PERRAL Supple Neck,No JVD, No cervical lymphadenopathy appriciated.  Symmetrical Chest wall movement, Good air movement bilaterally, CTAB RRR,No Gallops, Rubs or new Murmurs, No Parasternal Heave +ve B.Sounds, Abd Soft,  No organomegaly appriciated, No rebound - guarding or rigidity. No Cyanosis, Clubbing or edema, No new Rash or bruise    Data Review:    CBC Recent Labs  Lab 08/17/18 2311 08/18/18 0327  WBC 4.4 4.5  HGB 11.1* 11.2*  HCT 32.4* 33.0*  PLT 175 174  MCV 87.1 87.3  MCH 29.8 29.6  MCHC 34.3 33.9  RDW 14.4 14.5  LYMPHSABS 1.2  --   MONOABS 0.4  --   EOSABS 0.1  --   BASOSABS 0.0  --     Chemistries  Recent Labs  Lab 08/17/18 2311 08/18/18 0327  NA 138 136  K 3.2* 3.6  CL 110 108  CO2 23 19*  GLUCOSE 91 77  BUN 5* <5*  CREATININE 0.65 0.59  CALCIUM 8.2* 8.2*  MG  --  1.6*  AST 217* 213*  ALT 304* 297*  ALKPHOS 221* 233*  BILITOT 6.0* 6.4*    ------------------------------------------------------------------------------------------------------------------ Recent Labs    08/18/18 0327  CHOL 185  HDL 23*  LDLCALC 140*  TRIG 112  CHOLHDL 8.0    No results found for: HGBA1C ------------------------------------------------------------------------------------------------------------------ Recent Labs    08/18/18 0327  TSH 7.314*   ------------------------------------------------------------------------------------------------------------------ No results for input(s): VITAMINB12, FOLATE, FERRITIN, TIBC, IRON, RETICCTPCT in the last 72 hours.  Coagulation profile Recent Labs  Lab 08/17/18 2311  INR 1.0    No results for input(s): DDIMER in the last 72 hours.  Cardiac Enzymes No results for input(s): CKMB, TROPONINI, MYOGLOBIN in the last 168 hours.  Invalid input(s): CK ------------------------------------------------------------------------------------------------------------------ No results found for: BNP  Micro Results No results found for this or any previous visit (from the past 240 hour(s)).  Radiology Reports Mr Abdomen Mrcp Wo Contrast  Result Date: 08/18/2018 CLINICAL DATA:  Jaundice.  Abdominal pain.  History of pancreatitis. EXAM: MRI ABDOMEN WITHOUT CONTRAST  (INCLUDING MRCP) TECHNIQUE: Multiplanar multisequence MR imaging of the abdomen was performed. Heavily T2-weighted images of the biliary and pancreatic ducts were obtained, and three-dimensional MRCP images were rendered by post processing. COMPARISON:  Ultrasound of 08/17/2018.  Prior CT of 10/06/2014 FINDINGS: Portions of exam are mildly motion degraded. Lower chest: Normal heart size without pericardial or pleural effusion. Hepatobiliary: No focal liver lesion. Moderate to marked intrahepatic biliary duct dilatation. The common duct is moderately dilated for age, including at 14 mm on image 23/11. There is at least 1  distal common duct stone,  best evaluated on image 2/16. This measures on the order of 4-5 mm. There is likely a second immediately proximal 2 mm stone. See also image 28/4. Pancreas: The pancreatic tail and upstream body are absent, likely developmental when compared to the 2016 exam. There is peripancreatic edema surrounding the head and neck, including on image 17/8. Fluid within the anterior pararenal space including on image 19/8. Spleen:  Normal in size, without focal abnormality. Adrenals/Urinary Tract: Normal adrenal glands. Normal kidneys, without hydronephrosis. Stomach/Bowel: Normal stomach and abdominal bowel loops. Vascular/Lymphatic: Normal aortic caliber. No abdominal adenopathy. Other:  Trace perihepatic ascites Musculoskeletal: No acute osseous abnormality. IMPRESSION: 1. Intra and extrahepatic biliary duct dilatation secondary to distal common duct stone or stones as detailed above. 2. Non complicated pancreatitis. 3. Cholelithiasis. 4. Trace perihepatic ascites. Electronically Signed   By: Jeronimo Greaves M.D.   On: 08/18/2018 11:16    Time Spent in minutes  30   Susa Raring M.D on 08/19/2018 at 9:02 AM  To page go to [email or web address] - password [facility]

## 2018-08-19 NOTE — Anesthesia Preprocedure Evaluation (Signed)
Anesthesia Evaluation  Patient identified by MRN, date of birth, ID band Patient awake    Reviewed: Allergy & Precautions, NPO status , Patient's Chart, lab work & pertinent test results  Airway Mallampati: II  TM Distance: >3 FB Neck ROM: Full    Dental no notable dental hx.    Pulmonary neg pulmonary ROS,    Pulmonary exam normal breath sounds clear to auscultation       Cardiovascular negative cardio ROS Normal cardiovascular exam Rhythm:Regular Rate:Normal     Neuro/Psych  Headaches, PSYCHIATRIC DISORDERS    GI/Hepatic Neg liver ROS, Elevated LFTs Pancreatitis Biliary obstruction Jaundice   Endo/Other  negative endocrine ROS  Renal/GU negative Renal ROS     Musculoskeletal negative musculoskeletal ROS (+)   Abdominal (+) + obese,   Peds  Hematology  (+) anemia ,   Anesthesia Other Findings bile duct stone  Reproductive/Obstetrics hcg negative                             Anesthesia Physical Anesthesia Plan  ASA: II  Anesthesia Plan: General   Post-op Pain Management:    Induction: Intravenous  PONV Risk Score and Plan: 4 or greater and Midazolam, Dexamethasone, Ondansetron and Treatment may vary due to age or medical condition  Airway Management Planned: Oral ETT  Additional Equipment:   Intra-op Plan:   Post-operative Plan: Extubation in OR  Informed Consent: I have reviewed the patients History and Physical, chart, labs and discussed the procedure including the risks, benefits and alternatives for the proposed anesthesia with the patient or authorized representative who has indicated his/her understanding and acceptance.     Dental advisory given  Plan Discussed with: CRNA  Anesthesia Plan Comments:         Anesthesia Quick Evaluation

## 2018-08-19 NOTE — Interval H&P Note (Signed)
History and Physical Interval Note:  08/19/2018 10:18 AM  Amber Byrd  has presented today for surgery, with the diagnosis of bile duct stone.  The various methods of treatment have been discussed with the patient and family. After consideration of risks, benefits and other options for treatment, the patient has consented to  Procedure(s): ENDOSCOPIC RETROGRADE CHOLANGIOPANCREATOGRAPHY (ERCP) (N/A) as a surgical intervention.  The patient's history has been reviewed, patient examined, no change in status, stable for surgery.  I have reviewed the patient's chart and labs.  Questions were answered to the patient's satisfaction.     Yancey Flemings

## 2018-08-19 NOTE — Transfer of Care (Signed)
Immediate Anesthesia Transfer of Care Note  Patient: Amber Byrd  Procedure(s) Performed: ENDOSCOPIC RETROGRADE CHOLANGIOPANCREATOGRAPHY (ERCP) (N/A )  Patient Location: PACU  Anesthesia Type:General  Level of Consciousness: awake and alert   Airway & Oxygen Therapy: Patient Spontanous Breathing and Patient connected to nasal cannula oxygen  Post-op Assessment: Report given to RN and Post -op Vital signs reviewed and stable  Post vital signs: Reviewed and stable  Last Vitals:  Vitals Value Taken Time  BP 134/82 08/19/2018 11:30 AM  Temp 36.3 C 08/19/2018 11:30 AM  Pulse 64 08/19/2018 11:33 AM  Resp 18 08/19/2018 11:33 AM  SpO2 98 % 08/19/2018 11:33 AM  Vitals shown include unvalidated device data.  Last Pain:  Vitals:   08/19/18 1130  TempSrc:   PainSc: 0-No pain      Patients Stated Pain Goal: 3 (08/19/18 0418)  Complications: No apparent anesthesia complications

## 2018-08-19 NOTE — Anesthesia Postprocedure Evaluation (Signed)
Anesthesia Post Note  Patient: TYNNETTA PILECKI  Procedure(s) Performed: ENDOSCOPIC RETROGRADE CHOLANGIOPANCREATOGRAPHY (ERCP) (N/A ) SPHINCTEROTOMY REMOVAL OF STONES     Patient location during evaluation: PACU Anesthesia Type: General Level of consciousness: awake and alert Pain management: pain level controlled Vital Signs Assessment: post-procedure vital signs reviewed and stable Respiratory status: spontaneous breathing, nonlabored ventilation, respiratory function stable and patient connected to nasal cannula oxygen Cardiovascular status: blood pressure returned to baseline and stable Postop Assessment: no apparent nausea or vomiting Anesthetic complications: no    Last Vitals:  Vitals:   08/19/18 1136 08/19/18 1139  BP: (!) 105/51 (!) 105/59  Pulse: 67 63  Resp: (!) 22 19  Temp:  (!) 36.3 C  SpO2: 98% 100%    Last Pain:  Vitals:   08/19/18 1136  TempSrc:   PainSc: Asleep                 Ryan P Ellender

## 2018-08-20 ENCOUNTER — Inpatient Hospital Stay (HOSPITAL_COMMUNITY): Payer: Medicaid Other | Admitting: Certified Registered Nurse Anesthetist

## 2018-08-20 ENCOUNTER — Encounter (HOSPITAL_COMMUNITY)
Admission: AD | Disposition: A | Discharge status: Home / Self Care | Payer: Self-pay | Source: Other Acute Inpatient Hospital | Attending: Internal Medicine

## 2018-08-20 ENCOUNTER — Encounter (HOSPITAL_COMMUNITY): Payer: Self-pay | Admitting: Certified Registered Nurse Anesthetist

## 2018-08-20 HISTORY — PX: CHOLECYSTECTOMY: SHX55

## 2018-08-20 LAB — COMPREHENSIVE METABOLIC PANEL
ALT: 335 U/L — ABNORMAL HIGH (ref 0–44)
AST: 237 U/L — ABNORMAL HIGH (ref 15–41)
Albumin: 3.3 g/dL — ABNORMAL LOW (ref 3.5–5.0)
Alkaline Phosphatase: 310 U/L — ABNORMAL HIGH (ref 38–126)
Anion gap: 9 (ref 5–15)
BUN: <5 mg/dL — ABNORMAL LOW (ref 6–20)
CALCIUM: 9.1 mg/dL (ref 8.9–10.3)
CO2: 23 mmol/L (ref 22–32)
Chloride: 104 mmol/L (ref 98–111)
Creatinine, Ser: 0.67 mg/dL (ref 0.44–1.00)
GFR calc Af Amer: >60 mL/min (ref >60)
Glucose, Bld: 104 mg/dL — ABNORMAL HIGH (ref 70–99)
Potassium: 4.1 mmol/L (ref 3.5–5.1)
Sodium: 136 mmol/L (ref 135–145)
Total Bilirubin: 3.6 mg/dL — ABNORMAL HIGH (ref 0.3–1.2)
Total Protein: 6.6 g/dL (ref 6.5–8.1)

## 2018-08-20 LAB — MAGNESIUM: MAGNESIUM: 1.9 mg/dL (ref 1.7–2.4)

## 2018-08-20 LAB — H. PYLORI ANTIBODY, IGG: H Pylori IgG: 2.22 Index Value — ABNORMAL HIGH (ref 0.00–0.79)

## 2018-08-20 SURGERY — LAPAROSCOPIC CHOLECYSTECTOMY WITH INTRAOPERATIVE CHOLANGIOGRAM
Anesthesia: General

## 2018-08-20 MED ORDER — MIDAZOLAM HCL 2 MG/2ML IJ SOLN
INTRAMUSCULAR | Status: AC
  Filled 2018-08-20: qty 2

## 2018-08-20 MED ORDER — MIDAZOLAM HCL 5 MG/5ML IJ SOLN
INTRAMUSCULAR | Status: DC | PRN
  Administered 2018-08-20: 2 mg via INTRAVENOUS

## 2018-08-20 MED ORDER — OXYCODONE HCL 5 MG PO TABS: 5.0000 mg | ORAL_TABLET | Freq: Once | ORAL | Status: DC | PRN

## 2018-08-20 MED ORDER — FENTANYL CITRATE (PF) 100 MCG/2ML IJ SOLN
25.0000 ug | INTRAMUSCULAR | Status: DC | PRN
  Administered 2018-08-20 (×3): 50 ug via INTRAVENOUS

## 2018-08-20 MED ORDER — ONDANSETRON HCL 4 MG/2ML IJ SOLN
INTRAMUSCULAR | Status: AC
  Filled 2018-08-20: qty 2

## 2018-08-20 MED ORDER — CELECOXIB 200 MG PO CAPS
200.0000 mg | ORAL_CAPSULE | ORAL | Status: AC
  Administered 2018-08-20: 200 mg via ORAL
  Filled 2018-08-20: qty 1

## 2018-08-20 MED ORDER — PROPOFOL 10 MG/ML IV BOLUS
INTRAVENOUS | Status: AC
  Filled 2018-08-20: qty 20

## 2018-08-20 MED ORDER — 0.9 % SODIUM CHLORIDE (POUR BTL) OPTIME
TOPICAL | Status: DC | PRN
  Administered 2018-08-20: 1000 mL

## 2018-08-20 MED ORDER — ROCURONIUM BROMIDE 50 MG/5ML IV SOSY
PREFILLED_SYRINGE | INTRAVENOUS | Status: AC
  Filled 2018-08-20: qty 5

## 2018-08-20 MED ORDER — GABAPENTIN 300 MG PO CAPS
300.0000 mg | ORAL_CAPSULE | ORAL | Status: AC
  Administered 2018-08-20: 300 mg via ORAL
  Filled 2018-08-20: qty 1

## 2018-08-20 MED ORDER — FENTANYL CITRATE (PF) 100 MCG/2ML IJ SOLN
INTRAMUSCULAR | Status: AC
  Filled 2018-08-20: qty 2

## 2018-08-20 MED ORDER — PROPOFOL 10 MG/ML IV BOLUS
INTRAVENOUS | Status: DC | PRN
  Administered 2018-08-20: 30 mg via INTRAVENOUS
  Administered 2018-08-20: 140 mg via INTRAVENOUS
  Administered 2018-08-20: 30 mg via INTRAVENOUS

## 2018-08-20 MED ORDER — CHLORHEXIDINE GLUCONATE CLOTH 2 % EX PADS: 6.0000 | MEDICATED_PAD | Freq: Once | CUTANEOUS | Status: DC

## 2018-08-20 MED ORDER — OXYCODONE HCL 5 MG/5ML PO SOLN: 5.0000 mg | Freq: Once | ORAL | Status: DC | PRN

## 2018-08-20 MED ORDER — METHOCARBAMOL 500 MG PO TABS: 500.0000 mg | ORAL_TABLET | Freq: Three times a day (TID) | ORAL | Status: DC | PRN

## 2018-08-20 MED ORDER — DEXAMETHASONE SODIUM PHOSPHATE 10 MG/ML IJ SOLN
INTRAMUSCULAR | Status: DC | PRN
  Administered 2018-08-20: 5 mg via INTRAVENOUS

## 2018-08-20 MED ORDER — ACETAMINOPHEN 160 MG/5ML PO SOLN: 1000.0000 mg | Freq: Once | ORAL | Status: DC | PRN

## 2018-08-20 MED ORDER — ROCURONIUM BROMIDE 10 MG/ML (PF) SYRINGE: PREFILLED_SYRINGE | INTRAVENOUS | Status: DC | PRN

## 2018-08-20 MED ORDER — ROCURONIUM BROMIDE 50 MG/5ML IV SOSY
PREFILLED_SYRINGE | INTRAVENOUS | Status: DC | PRN
  Administered 2018-08-20: 50 mg via INTRAVENOUS

## 2018-08-20 MED ORDER — BUPIVACAINE-EPINEPHRINE (PF) 0.25% -1:200000 IJ SOLN
INTRAMUSCULAR | Status: AC
  Filled 2018-08-20: qty 30

## 2018-08-20 MED ORDER — SUGAMMADEX SODIUM 200 MG/2ML IV SOLN
INTRAVENOUS | Status: DC | PRN
  Administered 2018-08-20: 200 mg via INTRAVENOUS

## 2018-08-20 MED ORDER — BUPIVACAINE-EPINEPHRINE 0.25% -1:200000 IJ SOLN
INTRAMUSCULAR | Status: DC | PRN
  Administered 2018-08-20: 30 mL

## 2018-08-20 MED ORDER — LIDOCAINE 2% (20 MG/ML) 5 ML SYRINGE
INTRAMUSCULAR | Status: DC | PRN
  Administered 2018-08-20: 60 mg via INTRAVENOUS

## 2018-08-20 MED ORDER — FENTANYL CITRATE (PF) 100 MCG/2ML IJ SOLN
INTRAMUSCULAR | Status: AC
  Administered 2018-08-20: 50 ug via INTRAVENOUS
  Filled 2018-08-20: qty 2

## 2018-08-20 MED ORDER — OXYCODONE HCL 5 MG PO TABS
5.0000 mg | ORAL_TABLET | ORAL | Status: DC | PRN
  Administered 2018-08-20: 5 mg via ORAL
  Filled 2018-08-20: qty 1

## 2018-08-20 MED ORDER — ACETAMINOPHEN 10 MG/ML IV SOLN: 1000.0000 mg | Freq: Once | INTRAVENOUS | Status: DC | PRN

## 2018-08-20 MED ORDER — ACETAMINOPHEN 500 MG PO TABS
1000.0000 mg | ORAL_TABLET | ORAL | Status: AC
  Administered 2018-08-20: 1000 mg via ORAL
  Filled 2018-08-20: qty 2

## 2018-08-20 MED ORDER — POTASSIUM CHLORIDE 2 MEQ/ML IV SOLN
INTRAVENOUS | Status: DC
  Filled 2018-08-20: qty 1000

## 2018-08-20 MED ORDER — MORPHINE SULFATE (PF) 2 MG/ML IV SOLN: 1.0000 mg | INTRAVENOUS | Status: DC | PRN

## 2018-08-20 MED ORDER — DEXAMETHASONE SODIUM PHOSPHATE 10 MG/ML IJ SOLN
INTRAMUSCULAR | Status: AC
  Filled 2018-08-20: qty 1

## 2018-08-20 MED ORDER — FENTANYL CITRATE (PF) 250 MCG/5ML IJ SOLN
INTRAMUSCULAR | Status: DC | PRN
  Administered 2018-08-20 (×2): 50 ug via INTRAVENOUS
  Administered 2018-08-20: 100 ug via INTRAVENOUS
  Administered 2018-08-20: 50 ug via INTRAVENOUS

## 2018-08-20 MED ORDER — ONDANSETRON HCL 4 MG/2ML IJ SOLN
INTRAMUSCULAR | Status: DC | PRN
  Administered 2018-08-20: 4 mg via INTRAVENOUS

## 2018-08-20 MED ORDER — CEFAZOLIN SODIUM-DEXTROSE 2-4 GM/100ML-% IV SOLN
2.0000 g | INTRAVENOUS | Status: AC
  Administered 2018-08-20: 2 g via INTRAVENOUS
  Filled 2018-08-20: qty 100

## 2018-08-20 MED ORDER — ACETAMINOPHEN 500 MG PO TABS: 1000.0000 mg | ORAL_TABLET | Freq: Once | ORAL | Status: DC | PRN

## 2018-08-20 MED ORDER — LIDOCAINE 2% (20 MG/ML) 5 ML SYRINGE
INTRAMUSCULAR | Status: AC
  Filled 2018-08-20: qty 5

## 2018-08-20 MED ORDER — ACETAMINOPHEN 325 MG PO TABS: 650.0000 mg | ORAL_TABLET | Freq: Four times a day (QID) | ORAL | Status: DC | PRN

## 2018-08-20 MED ORDER — FENTANYL CITRATE (PF) 250 MCG/5ML IJ SOLN
INTRAMUSCULAR | Status: AC
  Filled 2018-08-20: qty 5

## 2018-08-20 MED ORDER — LACTATED RINGERS IV SOLN
INTRAVENOUS | Status: DC | PRN
  Administered 2018-08-20 (×2): via INTRAVENOUS

## 2018-08-20 SURGICAL SUPPLY — 44 items
APPLIER CLIP ROT 10 11.4 M/L (STAPLE) ×3
BLADE CLIPPER SURG (BLADE) IMPLANT
CANISTER SUCT 3000ML PPV (MISCELLANEOUS) ×3 IMPLANT
CHLORAPREP W/TINT 26ML (MISCELLANEOUS) ×3 IMPLANT
CLIP APPLIE ROT 10 11.4 M/L (STAPLE) ×1 IMPLANT
COVER MAYO STAND STRL (DRAPES) ×3 IMPLANT
COVER SURGICAL LIGHT HANDLE (MISCELLANEOUS) ×3 IMPLANT
COVER WAND RF STERILE (DRAPES) ×1 IMPLANT
DECANTER SPIKE VIAL GLASS SM (MISCELLANEOUS) ×2 IMPLANT
DERMABOND ADHESIVE PROPEN (GAUZE/BANDAGES/DRESSINGS) ×2
DERMABOND ADVANCED (GAUZE/BANDAGES/DRESSINGS) ×2
DERMABOND ADVANCED .7 DNX12 (GAUZE/BANDAGES/DRESSINGS) ×1 IMPLANT
DERMABOND ADVANCED .7 DNX6 (GAUZE/BANDAGES/DRESSINGS) IMPLANT
DRAPE C-ARM 42X72 X-RAY (DRAPES) ×1 IMPLANT
DRAPE WARM FLUID 44X44 (DRAPE) ×3 IMPLANT
ELECT REM PT RETURN 9FT ADLT (ELECTROSURGICAL) ×3
ELECTRODE REM PT RTRN 9FT ADLT (ELECTROSURGICAL) ×1 IMPLANT
GLOVE BIO SURGEON STRL SZ8 (GLOVE) ×3 IMPLANT
GLOVE BIOGEL PI IND STRL 8 (GLOVE) ×1 IMPLANT
GLOVE BIOGEL PI INDICATOR 8 (GLOVE) ×2
GOWN STRL REUS W/ TWL LRG LVL3 (GOWN DISPOSABLE) ×2 IMPLANT
GOWN STRL REUS W/ TWL XL LVL3 (GOWN DISPOSABLE) ×1 IMPLANT
GOWN STRL REUS W/TWL LRG LVL3 (GOWN DISPOSABLE) ×6
GOWN STRL REUS W/TWL XL LVL3 (GOWN DISPOSABLE) ×2
KIT BASIN OR (CUSTOM PROCEDURE TRAY) ×3 IMPLANT
KIT TURNOVER KIT B (KITS) ×3 IMPLANT
NS IRRIG 1000ML POUR BTL (IV SOLUTION) ×3 IMPLANT
PAD ARMBOARD 7.5X6 YLW CONV (MISCELLANEOUS) ×3 IMPLANT
POUCH RETRIEVAL ECOSAC 10 (ENDOMECHANICALS) ×1 IMPLANT
POUCH RETRIEVAL ECOSAC 10MM (ENDOMECHANICALS) ×2
SCISSORS LAP 5X35 DISP (ENDOMECHANICALS) ×3 IMPLANT
SET CHOLANGIOGRAPH 5 50 .035 (SET/KITS/TRAYS/PACK) ×1 IMPLANT
SET IRRIG TUBING LAPAROSCOPIC (IRRIGATION / IRRIGATOR) ×3 IMPLANT
SET TUBE SMOKE EVAC HIGH FLOW (TUBING) ×3 IMPLANT
SLEEVE ENDOPATH XCEL 5M (ENDOMECHANICALS) ×3 IMPLANT
SPECIMEN JAR SMALL (MISCELLANEOUS) ×3 IMPLANT
SUT MNCRL AB 4-0 PS2 18 (SUTURE) ×3 IMPLANT
TOWEL OR 17X24 6PK STRL BLUE (TOWEL DISPOSABLE) ×3 IMPLANT
TOWEL OR 17X26 10 PK STRL BLUE (TOWEL DISPOSABLE) ×3 IMPLANT
TRAY LAPAROSCOPIC MC (CUSTOM PROCEDURE TRAY) ×3 IMPLANT
TROCAR XCEL BLUNT TIP 100MML (ENDOMECHANICALS) ×3 IMPLANT
TROCAR XCEL NON-BLD 11X100MML (ENDOMECHANICALS) ×3 IMPLANT
TROCAR XCEL NON-BLD 5MMX100MML (ENDOMECHANICALS) ×3 IMPLANT
WATER STERILE IRR 1000ML POUR (IV SOLUTION) ×3 IMPLANT

## 2018-08-20 NOTE — H&P (View-Only) (Signed)
1 Day Post-Op   Subjective/Chief Complaint:feels good no pain   Objective: Vital signs in last 24 hours: Temp:  [97.4 F (36.3 C)-98.1 F (36.7 C)] 98.1 F (36.7 C) (03/09 0436) Pulse Rate:  [60-78] 60 (03/09 0436) Resp:  [18-29] 18 (03/09 0436) BP: (105-143)/(51-83) 137/80 (03/09 0436) SpO2:  [97 %-100 %] 100 % (03/09 0436) Last BM Date: 08/19/18  Intake/Output from previous day: 03/08 0701 - 03/09 0700 In: 2443.3 [I.V.:2443.3] Out: 0  Intake/Output this shift: No intake/output data recorded.  GI: soft, non-tender; bowel sounds normal; no masses,  no organomegaly  Lab Results:  Recent Labs    08/18/18 0327 08/19/18 0744  WBC 4.5 4.1  HGB 11.2* 12.1  HCT 33.0* 36.3  PLT 174 201   BMET Recent Labs    08/19/18 0744 08/20/18 0400  NA 136 136  K 4.1 4.1  CL 106 104  CO2 18* 23  GLUCOSE 78 104*  BUN <5* <5*  CREATININE 0.65 0.67  CALCIUM 9.1 9.1   PT/INR Recent Labs    08/17/18 2311 08/19/18 0744  LABPROT 13.4 13.3  INR 1.0 1.0   ABG No results for input(s): PHART, HCO3 in the last 72 hours.  Invalid input(s): PCO2, PO2  Studies/Results: Mr Abdomen Mrcp Wo Contrast  Result Date: 08/18/2018 CLINICAL DATA:  Jaundice.  Abdominal pain.  History of pancreatitis. EXAM: MRI ABDOMEN WITHOUT CONTRAST  (INCLUDING MRCP) TECHNIQUE: Multiplanar multisequence MR imaging of the abdomen was performed. Heavily T2-weighted images of the biliary and pancreatic ducts were obtained, and three-dimensional MRCP images were rendered by post processing. COMPARISON:  Ultrasound of 08/17/2018.  Prior CT of 10/06/2014 FINDINGS: Portions of exam are mildly motion degraded. Lower chest: Normal heart size without pericardial or pleural effusion. Hepatobiliary: No focal liver lesion. Moderate to marked intrahepatic biliary duct dilatation. The common duct is moderately dilated for age, including at 14 mm on image 23/11. There is at least 1 distal common duct stone, best evaluated on image  2/16. This measures on the order of 4-5 mm. There is likely a second immediately proximal 2 mm stone. See also image 28/4. Pancreas: The pancreatic tail and upstream body are absent, likely developmental when compared to the 2016 exam. There is peripancreatic edema surrounding the head and neck, including on image 17/8. Fluid within the anterior pararenal space including on image 19/8. Spleen:  Normal in size, without focal abnormality. Adrenals/Urinary Tract: Normal adrenal glands. Normal kidneys, without hydronephrosis. Stomach/Bowel: Normal stomach and abdominal bowel loops. Vascular/Lymphatic: Normal aortic caliber. No abdominal adenopathy. Other:  Trace perihepatic ascites Musculoskeletal: No acute osseous abnormality. IMPRESSION: 1. Intra and extrahepatic biliary duct dilatation secondary to distal common duct stone or stones as detailed above. 2. Non complicated pancreatitis. 3. Cholelithiasis. 4. Trace perihepatic ascites. Electronically Signed   By: Kyle  Talbot M.D.   On: 08/18/2018 11:16   Dg Ercp Biliary & Pancreatic Ducts  Result Date: 08/19/2018 CLINICAL DATA:  ERCP. EXAM: ERCP TECHNIQUE: Multiple spot images obtained with the fluoroscopic device and submitted for interpretation post-procedure. COMPARISON:  MRCP-08/18/2018 FLUOROSCOPY TIME:  3 minutes, 43 seconds FINDINGS: 10 spot intraoperative fluoroscopic images of the right upper abdominal quadrant during ERCP are provided for review Initial image demonstrates an ERCP probe overlying the right upper abdominal quadrant. Subsequent images demonstrate selective cannulation and opacification of distal aspect of the CBD which appears mild to moderately dilated. Subsequent images demonstrate insufflation of a balloon within the central aspect the CBD with subsequent biliary sweeping and presumed sphincterotomy. There   is minimal opacification intrahepatic biliary tree which appears nondilated. There is minimal opacification of the central aspect of the  cystic duct. IMPRESSION: ERCP with biliary sweeping and presumed sphincterotomy as above. These images were submitted for radiologic interpretation only. Please see the procedural report for the amount of contrast and the fluoroscopy time utilized. Electronically Signed   By: Simonne Come M.D.   On: 08/19/2018 14:33    Anti-infectives: Anti-infectives (From admission, onward)   Start     Dose/Rate Route Frequency Ordered Stop   08/19/18 0930  ciprofloxacin (CIPRO) IVPB 400 mg  Status:  Discontinued     400 mg 200 mL/hr over 60 Minutes Intravenous  Once 08/19/18 0962 08/19/18 1151      Assessment/Plan: Gallstone pancreatitis choledocolithiasis s/p ERCP   OR today for LGB The procedure has been discussed with the patient. Operative and non operative treatments have been discussed. Risks of surgery include bleeding, infection,  Common bile duct injury,  Injury to the stomach,liver, colon,small intestine, abdominal wall,  Diaphragm,  Major blood vessels,  And the need for an open procedure.  Other risks include worsening of medical problems, death,  DVT and pulmonary embolism, and cardiovascular events.   Medical options have also been discussed. The patient has been informed of long term expectations of surgery and non surgical options,  The patient agrees to proceed.     LOS: 3 days    Amber Byrd Alec Jaros 08/20/2018

## 2018-08-20 NOTE — Anesthesia Preprocedure Evaluation (Signed)
Anesthesia Evaluation  Patient identified by MRN, date of birth, ID band Patient awake    Reviewed: Allergy & Precautions, NPO status , Patient's Chart, lab work & pertinent test results  History of Anesthesia Complications Negative for: history of anesthetic complications  Airway Mallampati: II  TM Distance: >3 FB Neck ROM: Full    Dental  (+) Teeth Intact, Dental Advisory Given   Pulmonary neg pulmonary ROS,    breath sounds clear to auscultation       Cardiovascular negative cardio ROS   Rhythm:Regular Rate:Normal     Neuro/Psych  Headaches, PSYCHIATRIC DISORDERS    GI/Hepatic Neg liver ROS, Elevated LFTs Pancreatitis Biliary obstruction Jaundice   Endo/Other  negative endocrine ROS  Renal/GU negative Renal ROS     Musculoskeletal negative musculoskeletal ROS (+)   Abdominal (+) + obese,   Peds  Hematology  (+) anemia ,   Anesthesia Other Findings bile duct stone  Reproductive/Obstetrics hcg negative                             Anesthesia Physical Anesthesia Plan  ASA: II  Anesthesia Plan: General   Post-op Pain Management:    Induction: Intravenous  PONV Risk Score and Plan: 2 and Ondansetron and Dexamethasone  Airway Management Planned: Oral ETT  Additional Equipment: None  Intra-op Plan:   Post-operative Plan: Extubation in OR  Informed Consent: I have reviewed the patients History and Physical, chart, labs and discussed the procedure including the risks, benefits and alternatives for the proposed anesthesia with the patient or authorized representative who has indicated his/her understanding and acceptance.     Dental advisory given  Plan Discussed with: CRNA and Surgeon  Anesthesia Plan Comments:         Anesthesia Quick Evaluation

## 2018-08-20 NOTE — Transfer of Care (Signed)
Immediate Anesthesia Transfer of Care Note  Patient: Amber Byrd  Procedure(s) Performed: LAPAROSCOPIC CHOLECYSTECTOMY (N/A )  Patient Location: PACU  Anesthesia Type:General  Level of Consciousness: awake, alert  and oriented  Airway & Oxygen Therapy: Patient Spontanous Breathing  Post-op Assessment: Report given to RN, Post -op Vital signs reviewed and stable and Patient moving all extremities X 4  Post vital signs: Reviewed and stable  Last Vitals:  Vitals Value Taken Time  BP 112/92 08/20/2018  1:23 PM  Temp    Pulse 80 08/20/2018  1:23 PM  Resp 26 08/20/2018  1:23 PM  SpO2 100 % 08/20/2018  1:23 PM  Vitals shown include unvalidated device data.  Last Pain:  Vitals:   08/20/18 0802  TempSrc:   PainSc: 0-No pain      Patients Stated Pain Goal: 3 (08/19/18 0418)  Complications: No apparent anesthesia complications

## 2018-08-20 NOTE — Interval H&P Note (Signed)
History and Physical Interval Note:  08/20/2018 11:22 AM  Amber Byrd  has presented today for surgery, with the diagnosis of acute cholecystitis.  The various methods of treatment have been discussed with the patient and family. After consideration of risks, benefits and other options for treatment, the patient has consented to  Procedure(s): LAPAROSCOPIC CHOLECYSTECTOMY POSSIBLE INTRAOPERATIVE CHOLANGIOGRAM (N/A) as a surgical intervention.  The patient's history has been reviewed, patient examined, no change in status, stable for surgery.  I have reviewed the patient's chart and labs.  Questions were answered to the patient's satisfaction.     Redell Bhandari A Orey Moure

## 2018-08-20 NOTE — Op Note (Signed)
Laparoscopic Cholecystectomy Procedure Note  Indications: This patient presents with symptomatic gallbladder disease and will undergo laparoscopic cholecystectomy. The procedure has been discussed with the patient. Operative and non operative treatments have been discussed. Risks of surgery include bleeding, infection,  Common bile duct injury,  Injury to the stomach,liver, colon,small intestine, abdominal wall,  Diaphragm,  Major blood vessels,  And the need for an open procedure.  Other risks include worsening of medical problems, death,  DVT and pulmonary embolism, and cardiovascular events.   Medical options have also been discussed. The patient has been informed of long term expectations of surgery and non surgical options,  The patient agrees to proceed.    Pre-operative Diagnosis: Calculus of gallbladder and bile duct with acute cholecystitis, without mention of obstruction  Post-operative Diagnosis: Same  Surgeon: Clovis Pu Shere Eisenhart   Assistants: Rayburn PA   Anesthesia: General endotracheal anesthesia and Local anesthesia 0.25.% bupivacaine, with epinephrine  ASA Class: 1  Procedure Details  The patient was seen again in the Holding Room. The risks, benefits, complications, treatment options, and expected outcomes were discussed with the patient. The possibilities of reaction to medication, pulmonary aspiration, perforation of viscus, bleeding, recurrent infection, finding a normal gallbladder, the need for additional procedures, failure to diagnose a condition, the possible need to convert to an open procedure, and creating a complication requiring transfusion or operation were discussed with the patient. The patient and/or family concurred with the proposed plan, giving informed consent. The site of surgery properly noted/marked. The patient was taken to Operating Room, identified as Amber Byrd and the procedure verified as Laparoscopic Cholecystectomy with Intraoperative  Cholangiograms. A Time Out was held and the above information confirmed.  Prior to the induction of general anesthesia, antibiotic prophylaxis was administered. General endotracheal anesthesia was then administered and tolerated well. After the induction, the abdomen was prepped in the usual sterile fashion. The patient was positioned in the supine position with the left arm comfortably tucked, along with some reverse Trendelenburg.  Local anesthetic agent was injected into the skin near the umbilicus and an incision made. The midline fascia was incised and the Hasson technique was used to introduce a 12 mm port under direct vision. It was secured with a figure of eight Vicryl suture placed in the usual fashion. Pneumoperitoneum was then created with CO2 and tolerated well without any adverse changes in the patient's vital signs. Additional trocars were introduced under direct vision with an 11 mm trocar in the epigastrium and two  5 mm trocars in the right upper quadrant. All skin incisions were infiltrated with a local anesthetic agent before making the incision and placing the trocars.   The gallbladder was identified, the fundus grasped and retracted cephalad. Adhesions were lysed bluntly and with the electrocautery where indicated, taking care not to injure any adjacent organs or viscus. The infundibulum was grasped and retracted laterally, exposing the peritoneum overlying the triangle of Calot. This was then divided and exposed in a blunt fashion. The cystic duct was clearly identified and bluntly dissected circumferentially. The junctions of the gallbladder, cystic duct and common bile duct were clearly identified prior to the division of any linear structure.      The cystic duct was extremely small and pt had recent ERCP. Cholangiogram not performed. Critical view obtained.  The cystic duct was then  ligated with surgical clips  on the patient side and  clipped on the gallbladder side and  divided. The cystic artery was identified, dissected free,  ligated with clips and divided as well. Posterior cystic artery clipped and divided.  The gallbladder was dissected from the liver bed in retrograde fashion with the electrocautery. The gallbladder was removed. The liver bed was irrigated and inspected. Hemostasis was achieved with the electrocautery. Copious irrigation was utilized and was repeatedly aspirated until clear all particulate matter. Hemostasis was achieved with no signs  Of bleeding or bile leakage.  Pneumoperitoneum was completely reduced after viewing removal of the trocars under direct vision. The wound was thoroughly irrigated and the fascia was then closed with a figure of eight suture; the skin was then closed with 4 O monocryl  and a sterile dressing was applied.  Instrument, sponge, and needle counts were correct at closure and at the conclusion of the case.   Findings: Cholecystitis with Cholelithiasis  Estimated Blood Loss: less than 50 mL         Drains: none         Total IV Fluids: per record          Specimens: Gallbladder           Complications: None; patient tolerated the procedure well.         Disposition: PACU - hemodynamically stable.         Condition: stable

## 2018-08-20 NOTE — Progress Notes (Signed)
PROGRESS NOTE                                                                                                                                                                                                             Patient Demographics:    Amber Byrd, is a 28 y.o. female, DOB - 10/29/1990, ZOX:[id number]  Admit date - 08/17/2018   Admitting Physician Therisa DoyneAnastassia Doutova, MD  Outpatient Primary MD for the patient is Amber Byrd, [name], MD  LOS - 3  CC - Abd Pain     Brief Narrative  - 28 year old female otherwise healthy with history of mild depression admitted for epigastric abdominal pain [facility] showed CBD dilation on ultrasound with CBD measuring 13 mm, she had a suspicious 6 mm CBD calculus, she was subsequently transferred here for further care.   Subjective:   Patient in bed, appears comfortable, denies any headache, no fever, no chest pain or pressure, no shortness of breath , mild epigastric abdominal pain. No focal weakness.    Assessment  & Plan :     1.  Choledocholithiasis with gallstone pancreatitis - GI along with general surgery on board, being treated conservatively with bowel rest and IV fluids along with pain control, clinically stable, MRCP confirms CBD stone and obstruction.  ERCP with sphincterotomy and stone extraction by Dr. Marina GoodellPerry on 08/19/2018, clinically pancreatitis has resolved general surgery following and contemplating lap cholecystectomy on 08/20/2018.  2.  Mildly elevated TSH.  Could be sick euthyroid, outpatient recheck TSH in 4 to 6 weeks by PCP.  3.  Hypomagnesemia.  Replaced & stable.    Family Communication  :  None  Code Status :  Full  Disposition Plan  :  TBD  Consults  :  GI, CCS  Procedures  :    MRCP -     DVT Prophylaxis  : Heparin   Lab Results  Component Value Date   PLT 201 08/19/2018    Diet :  Diet Order            Diet NPO time specified Except for: Sips with Meds   Diet effective midnight               Inpatient Medications Scheduled Meds: . indomethacin  100 mg Rectal Once  . pantoprazole  40 mg Oral Daily   Continuous Infusions: . lactated ringers with kcl 125 mL/hr at 08/20/18 0604   PRN Meds:.morphine injection, ondansetron **OR** ondansetron (ZOFRAN) IV  Antibiotics  :   Anti-infectives (From admission, onward)   Start     Dose/Rate Route Frequency Ordered Stop   08/19/18 0930  ciprofloxacin (CIPRO) IVPB 400 mg  Status:  Discontinued     400 mg 200 mL/hr over 60 Minutes Intravenous  Once 08/19/18 0922 08/19/18 1151          Objective:   Vitals:   08/19/18 1139 08/19/18 1711 08/19/18 2120 08/20/18 0436  BP: (!) 105/59 137/83 (!) 143/81 137/80  Pulse: 63 76 63 60  Resp: 19 20 18 18   Temp: (!) 97.4 F (36.3 C)  98.1 F (36.7 C) 98.1 F (36.7 C)  TempSrc:   Oral   SpO2: 100% 97% 100% 100%  Weight:      Height:        Wt Readings from Last 3 Encounters:  08/17/18 80 kg  07/04/15 71.7 kg  10/06/14 71.7 kg     Intake/Output Summary (Last 24 hours) at 08/20/2018 0907 Last data filed at 08/20/2018 0525 Gross per 24 hour  Intake 2443.29 ml  Output 0 ml  Net 2443.29 ml     Physical Exam  Awake Alert, Oriented X 3, No new F.N deficits, Normal affect [place].AT,PERRAL Supple Neck,No JVD, No cervical lymphadenopathy appriciated.  Symmetrical Chest wall movement, Good air movement bilaterally, CTAB RRR,No Gallops, Rubs or new Murmurs, No Parasternal Heave +ve B.Sounds, Abd Soft, No tenderness, No organomegaly appriciated, No rebound - guarding or rigidity. No Cyanosis, Clubbing or edema, No new Rash or bruise     Data Review:    CBC Recent Labs  Lab 08/17/18 2311 08/18/18 0327 08/19/18 0744  WBC 4.4 4.5 4.1  HGB 11.1* 11.2* 12.1  HCT 32.4* 33.0* 36.3  PLT 175 174 201  MCV 87.1 87.3 86.8  MCH 29.8 29.6 28.9  MCHC 34.3 33.9 33.3  RDW 14.4 14.5 14.8  LYMPHSABS 1.2  --   --   MONOABS 0.4  --   --   EOSABS 0.1   --   --   BASOSABS 0.0  --   --     Chemistries  Recent Labs  Lab 08/17/18 2311 08/18/18 0327 08/19/18 0744 08/20/18 0400  NA 138 136 136 136  K 3.2* 3.6 4.1 4.1  CL 110 108 106 104  CO2 23 19* 18* 23  GLUCOSE 91 77 78 104*  BUN 5* <5* <5* <5*  CREATININE 0.65 0.59 0.65 0.67  CALCIUM 8.2* 8.2* 9.1 9.1  MG  --  1.6* 1.8 1.9  AST 217* 213* 230* 237*  ALT 304* 297* 315* 335*  ALKPHOS 221* 233* 333* 310*  BILITOT 6.0* 6.4* 8.6* 3.6*   ------------------------------------------------------------------------------------------------------------------ Recent Labs    08/18/18 0327  CHOL 185  HDL 23*  LDLCALC 140*  TRIG 112  CHOLHDL 8.0    No results found for: HGBA1C ------------------------------------------------------------------------------------------------------------------ Recent Labs    08/18/18 0327  TSH 7.314*   ------------------------------------------------------------------------------------------------------------------ No results for input(s): VITAMINB12, FOLATE, FERRITIN, TIBC, IRON, RETICCTPCT in the last 72 hours.  Coagulation profile Recent Labs  Lab 08/17/18 2311 08/19/18 0744  INR 1.0 1.0    No results for input(s): DDIMER in the last 72 hours.  Cardiac Enzymes No results for input(s): CKMB, TROPONINI, MYOGLOBIN in the last 168 hours.  Invalid input(s): CK ------------------------------------------------------------------------------------------------------------------ No results found for: BNP  Micro Results No results found for this or any previous visit (from the past 240 hour(s)).  Radiology Reports Mr Abdomen Mrcp Wo Contrast  Result Date: 08/18/2018 CLINICAL  DATA:  Jaundice.  Abdominal pain.  History of pancreatitis. EXAM: MRI ABDOMEN WITHOUT CONTRAST  (INCLUDING MRCP) TECHNIQUE: Multiplanar multisequence MR imaging of the abdomen was performed. Heavily T2-weighted images of the biliary and pancreatic ducts were obtained, and  three-dimensional MRCP images were rendered by post processing. COMPARISON:  Ultrasound of 08/17/2018.  Prior CT of 10/06/2014 FINDINGS: Portions of exam are mildly motion degraded. Lower chest: Normal heart size without pericardial or pleural effusion. Hepatobiliary: No focal liver lesion. Moderate to marked intrahepatic biliary duct dilatation. The common duct is moderately dilated for age, including at 14 mm on image 23/11. There is at least 1 distal common duct stone, best evaluated on image 2/16. This measures on the order of 4-5 mm. There is likely a second immediately proximal 2 mm stone. See also image 28/4. Pancreas: The pancreatic tail and upstream body are absent, likely developmental when compared to the 2016 exam. There is peripancreatic edema surrounding the head and neck, including on image 17/8. Fluid within the anterior pararenal space including on image 19/8. Spleen:  Normal in size, without focal abnormality. Adrenals/Urinary Tract: Normal adrenal glands. Normal kidneys, without hydronephrosis. Stomach/Bowel: Normal stomach and abdominal bowel loops. Vascular/Lymphatic: Normal aortic caliber. No abdominal adenopathy. Other:  Trace perihepatic ascites Musculoskeletal: No acute osseous abnormality. IMPRESSION: 1. Intra and extrahepatic biliary duct dilatation secondary to distal common duct stone or stones as detailed above. 2. Non complicated pancreatitis. 3. Cholelithiasis. 4. Trace perihepatic ascites. Electronically Signed   By: Jeronimo Greaves M.D.   On: 08/18/2018 11:16   Dg Ercp Biliary & Pancreatic Ducts  Result Date: 08/19/2018 CLINICAL DATA:  ERCP. EXAM: ERCP TECHNIQUE: Multiple spot images obtained with the fluoroscopic device and submitted for interpretation post-procedure. COMPARISON:  MRCP-08/18/2018 FLUOROSCOPY TIME:  3 minutes, 43 seconds FINDINGS: 10 spot intraoperative fluoroscopic images of the right upper abdominal quadrant during ERCP are provided for review Initial image  demonstrates an ERCP probe overlying the right upper abdominal quadrant. Subsequent images demonstrate selective cannulation and opacification of distal aspect of the CBD which appears mild to moderately dilated. Subsequent images demonstrate insufflation of a balloon within the central aspect the CBD with subsequent biliary sweeping and presumed sphincterotomy. There is minimal opacification intrahepatic biliary tree which appears nondilated. There is minimal opacification of the central aspect of the cystic duct. IMPRESSION: ERCP with biliary sweeping and presumed sphincterotomy as above. These images were submitted for radiologic interpretation only. Please see the procedural report for the amount of contrast and the fluoroscopy time utilized. Electronically Signed   By: Simonne Come M.D.   On: 08/19/2018 14:33    Time Spent in minutes  30   Susa Raring M.D on 08/20/2018 at 9:07 AM  To page go to [email or web address] - password [facility]

## 2018-08-20 NOTE — Progress Notes (Signed)
Patient seen hospital computer chart reviewed and case discussed with my partner Dr. Matthias Hughs and patient looks good status post lap chole and her op note was reviewed as well as her ERCP and MRCP and she had no problems from her ERCP and is looking forward to her dinner and her bilirubin has dropped a lot transaminases about the same and I will arrange recheck liver tests as an outpatient in our office on the day of surgical follow-up and please call us if we can be of any further assistance with this hospital stay but hopefully the patient should go home tomorrow

## 2018-08-20 NOTE — Progress Notes (Signed)
1 Day Post-Op   Subjective/Chief Complaint:feels good no pain   Objective: Vital signs in last 24 hours: Temp:  [97.4 F (36.3 C)-98.1 F (36.7 C)] 98.1 F (36.7 C) (03/09 0436) Pulse Rate:  [60-78] 60 (03/09 0436) Resp:  [18-29] 18 (03/09 0436) BP: (105-143)/(51-83) 137/80 (03/09 0436) SpO2:  [97 %-100 %] 100 % (03/09 0436) Last BM Date: 08/19/18  Intake/Output from previous day: 03/08 0701 - 03/09 0700 In: 2443.3 [I.V.:2443.3] Out: 0  Intake/Output this shift: No intake/output data recorded.  GI: soft, non-tender; bowel sounds normal; no masses,  no organomegaly  Lab Results:  Recent Labs    08/18/18 0327 08/19/18 0744  WBC 4.5 4.1  HGB 11.2* 12.1  HCT 33.0* 36.3  PLT 174 201   BMET Recent Labs    08/19/18 0744 08/20/18 0400  NA 136 136  K 4.1 4.1  CL 106 104  CO2 18* 23  GLUCOSE 78 104*  BUN <5* <5*  CREATININE 0.65 0.67  CALCIUM 9.1 9.1   PT/INR Recent Labs    08/17/18 2311 08/19/18 0744  LABPROT 13.4 13.3  INR 1.0 1.0   ABG No results for input(s): PHART, HCO3 in the last 72 hours.  Invalid input(s): PCO2, PO2  Studies/Results: Mr Abdomen Mrcp Wo Contrast  Result Date: 08/18/2018 CLINICAL DATA:  Jaundice.  Abdominal pain.  History of pancreatitis. EXAM: MRI ABDOMEN WITHOUT CONTRAST  (INCLUDING MRCP) TECHNIQUE: Multiplanar multisequence MR imaging of the abdomen was performed. Heavily T2-weighted images of the biliary and pancreatic ducts were obtained, and three-dimensional MRCP images were rendered by post processing. COMPARISON:  Ultrasound of 08/17/2018.  Prior CT of 10/06/2014 FINDINGS: Portions of exam are mildly motion degraded. Lower chest: Normal heart size without pericardial or pleural effusion. Hepatobiliary: No focal liver lesion. Moderate to marked intrahepatic biliary duct dilatation. The common duct is moderately dilated for age, including at 14 mm on image 23/11. There is at least 1 distal common duct stone, best evaluated on image  2/16. This measures on the order of 4-5 mm. There is likely a second immediately proximal 2 mm stone. See also image 28/4. Pancreas: The pancreatic tail and upstream body are absent, likely developmental when compared to the 2016 exam. There is peripancreatic edema surrounding the head and neck, including on image 17/8. Fluid within the anterior pararenal space including on image 19/8. Spleen:  Normal in size, without focal abnormality. Adrenals/Urinary Tract: Normal adrenal glands. Normal kidneys, without hydronephrosis. Stomach/Bowel: Normal stomach and abdominal bowel loops. Vascular/Lymphatic: Normal aortic caliber. No abdominal adenopathy. Other:  Trace perihepatic ascites Musculoskeletal: No acute osseous abnormality. IMPRESSION: 1. Intra and extrahepatic biliary duct dilatation secondary to distal common duct stone or stones as detailed above. 2. Non complicated pancreatitis. 3. Cholelithiasis. 4. Trace perihepatic ascites. Electronically Signed   By: Jeronimo Greaves M.D.   On: 08/18/2018 11:16   Dg Ercp Biliary & Pancreatic Ducts  Result Date: 08/19/2018 CLINICAL DATA:  ERCP. EXAM: ERCP TECHNIQUE: Multiple spot images obtained with the fluoroscopic device and submitted for interpretation post-procedure. COMPARISON:  MRCP-08/18/2018 FLUOROSCOPY TIME:  3 minutes, 43 seconds FINDINGS: 10 spot intraoperative fluoroscopic images of the right upper abdominal quadrant during ERCP are provided for review Initial image demonstrates an ERCP probe overlying the right upper abdominal quadrant. Subsequent images demonstrate selective cannulation and opacification of distal aspect of the CBD which appears mild to moderately dilated. Subsequent images demonstrate insufflation of a balloon within the central aspect the CBD with subsequent biliary sweeping and presumed sphincterotomy. There  is minimal opacification intrahepatic biliary tree which appears nondilated. There is minimal opacification of the central aspect of the  cystic duct. IMPRESSION: ERCP with biliary sweeping and presumed sphincterotomy as above. These images were submitted for radiologic interpretation only. Please see the procedural report for the amount of contrast and the fluoroscopy time utilized. Electronically Signed   By: Simonne Come M.D.   On: 08/19/2018 14:33    Anti-infectives: Anti-infectives (From admission, onward)   Start     Dose/Rate Route Frequency Ordered Stop   08/19/18 0930  ciprofloxacin (CIPRO) IVPB 400 mg  Status:  Discontinued     400 mg 200 mL/hr over 60 Minutes Intravenous  Once 08/19/18 0962 08/19/18 1151      Assessment/Plan: Gallstone pancreatitis choledocolithiasis s/p ERCP   OR today for LGB The procedure has been discussed with the patient. Operative and non operative treatments have been discussed. Risks of surgery include bleeding, infection,  Common bile duct injury,  Injury to the stomach,liver, colon,small intestine, abdominal wall,  Diaphragm,  Major blood vessels,  And the need for an open procedure.  Other risks include worsening of medical problems, death,  DVT and pulmonary embolism, and cardiovascular events.   Medical options have also been discussed. The patient has been informed of long term expectations of surgery and non surgical options,  The patient agrees to proceed.     LOS: 3 days    Clovis Pu Thurmond Hildebran 08/20/2018

## 2018-08-20 NOTE — Anesthesia Procedure Notes (Addendum)
Procedure Name: Intubation Date/Time: 08/20/2018 12:03 PM Performed by: Nils Pyle, CRNA Pre-anesthesia Checklist: Patient identified, Emergency Drugs available, Suction available and Patient being monitored Patient Re-evaluated:Patient Re-evaluated prior to induction Oxygen Delivery Method: Circle System Utilized Preoxygenation: Pre-oxygenation with 100% oxygen Induction Type: IV induction Ventilation: Mask ventilation without difficulty Laryngoscope Size: Miller and 2 Grade View: Grade I Tube type: Oral Tube size: 7.0 mm Number of attempts: 1 Airway Equipment and Method: Stylet Placement Confirmation: ETT inserted through vocal cords under direct vision,  positive ETCO2 and breath sounds checked- equal and bilateral Secured at: 21 cm Tube secured with: Tape Dental Injury: Teeth and Oropharynx as per pre-operative assessment

## 2018-08-21 ENCOUNTER — Encounter (HOSPITAL_COMMUNITY): Payer: Self-pay | Admitting: Surgery

## 2018-08-21 LAB — COMPREHENSIVE METABOLIC PANEL
ALBUMIN: 3.1 g/dL — AB (ref 3.5–5.0)
ALT: 377 U/L — ABNORMAL HIGH (ref 0–44)
AST: 258 U/L — ABNORMAL HIGH (ref 15–41)
Alkaline Phosphatase: 276 U/L — ABNORMAL HIGH (ref 38–126)
Anion gap: 9 (ref 5–15)
BUN: 5 mg/dL — ABNORMAL LOW (ref 6–20)
CALCIUM: 9.1 mg/dL (ref 8.9–10.3)
CO2: 25 mmol/L (ref 22–32)
Chloride: 104 mmol/L (ref 98–111)
Creatinine, Ser: 0.63 mg/dL (ref 0.44–1.00)
GFR calc Af Amer: >60 mL/min (ref >60)
GFR calc non Af Amer: >60 mL/min (ref >60)
Glucose, Bld: 120 mg/dL — ABNORMAL HIGH (ref 70–99)
Potassium: 4 mmol/L (ref 3.5–5.1)
Sodium: 138 mmol/L (ref 135–145)
Total Bilirubin: 2.6 mg/dL — ABNORMAL HIGH (ref 0.3–1.2)
Total Protein: 6.3 g/dL — ABNORMAL LOW (ref 6.5–8.1)

## 2018-08-21 LAB — CBC
HCT: 33.1 % — ABNORMAL LOW (ref 36.0–46.0)
Hemoglobin: 11.1 g/dL — ABNORMAL LOW (ref 12.0–15.0)
MCH: 29.5 pg (ref 26.0–34.0)
MCHC: 33.5 g/dL (ref 30.0–36.0)
MCV: 88 fL (ref 80.0–100.0)
PLATELETS: 229 10*3/uL (ref 150–400)
RBC: 3.76 MIL/uL — ABNORMAL LOW (ref 3.87–5.11)
RDW: 15.8 % — ABNORMAL HIGH (ref 11.5–15.5)
WBC: 8.1 10*3/uL (ref 4.0–10.5)
nRBC: 0 % (ref 0.0–0.2)

## 2018-08-21 MED ORDER — ACETAMINOPHEN 500 MG PO TABS: 500.0000 mg | ORAL_TABLET | Freq: Three times a day (TID) | ORAL | 0 refills | Status: AC | PRN

## 2018-08-21 MED ORDER — PANTOPRAZOLE SODIUM 40 MG PO TBEC: 40.0000 mg | DELAYED_RELEASE_TABLET | Freq: Every day | ORAL | 0 refills | Status: AC

## 2018-08-21 MED ORDER — NAPROXEN SODIUM 550 MG PO TABS: 550.0000 mg | ORAL_TABLET | Freq: Two times a day (BID) | ORAL | 0 refills | Status: AC | PRN

## 2018-08-21 NOTE — Discharge Instructions (Signed)
Follow with Primary MD Richardean Chimera, MD in 7 days   Get CBC, CMP checked  by Primary MD in 5-7 days    Activity: As tolerated with Full fall precautions use walker/cane & assistance as needed  Disposition Home   Diet: Heart Healthy   Special Instructions: If you have smoked or chewed Tobacco  in the last 2 yrs please stop smoking, stop any regular Alcohol  and or any Recreational drug use.  On your next visit with your primary care physician please Get Medicines reviewed and adjusted.  Please request your Prim.MD to go over all Hospital Tests and Procedure/Radiological results at the follow up, please get all Hospital records sent to your Prim MD by signing hospital release before you go home.  If you experience worsening of your admission symptoms, develop shortness of breath, life threatening emergency, suicidal or homicidal thoughts you must seek medical attention immediately by calling 911 or calling your MD immediately  if symptoms less severe.  You Must read complete instructions/literature along with all the possible adverse reactions/side effects for all the Medicines you take and that have been prescribed to you. Take any new Medicines after you have completely understood and accpet all the possible adverse reactions/side effects.       Managing Your Pain After Surgery Without Opioids    Thank you for participating in our program to help patients manage their pain after surgery without opioids. This is part of our effort to provide you with the best care possible, without exposing you or your family to the risk that opioids pose.  What pain can I expect after surgery? You can expect to have some pain after surgery. This is normal. The pain is typically worse the day after surgery, and quickly begins to get better. Many studies have found that many patients are able to manage their pain after surgery with Over-the-Counter (OTC) medications such as Tylenol and Motrin. If you  have a condition that does not allow you to take Tylenol or Motrin, notify your surgical team.  How will I manage my pain? The best strategy for controlling your pain after surgery is around the clock pain control with Tylenol (acetaminophen) and Motrin (ibuprofen or Advil). Alternating these medications with each other allows you to maximize your pain control. In addition to Tylenol and Motrin, you can use heating pads or ice packs on your incisions to help reduce your pain.  How will I alternate your regular strength over-the-counter pain medication? You will take a dose of pain medication every three hours. ; Start by taking 650 mg of Tylenol (2 pills of 325 mg) ; 3 hours later take 600 mg of Motrin (3 pills of 200 mg) ; 3 hours after taking the Motrin take 650 mg of Tylenol ; 3 hours after that take 600 mg of Motrin.   - 1 -  See example - if your first dose of Tylenol is at 12:00 PM   12:00 PM Tylenol 650 mg (2 pills of 325 mg)  3:00 PM Motrin 600 mg (3 pills of 200 mg)  6:00 PM Tylenol 650 mg (2 pills of 325 mg)  9:00 PM Motrin 600 mg (3 pills of 200 mg)  Continue alternating every 3 hours   We recommend that you follow this schedule around-the-clock for at least 3 days after surgery, or until you feel that it is no longer needed. Use the table on the last page of this handout to keep track of the  medications you are taking. Important: Do not take more than  of Tylenol or  of Motrin in a 24-hour period. Do not take ibuprofen/Motrin if you have a history of bleeding stomach ulcers, severe kidney disease, &/or actively taking a blood thinner  What if I still have pain? If you have pain that is not controlled with the over-the-counter pain medications (Tylenol and Motrin or Advil) you might have what we call breakthrough pain. You will receive a prescription for a small amount of an opioid pain medication such as Oxycodone, Tramadol, or Tylenol with Codeine. Use these  opioid pills in the first 24 hours after surgery if you have breakthrough pain. Do not take more than 1 pill every 4-6 hours.  If you still have uncontrolled pain after using all opioid pills, don't hesitate to call our staff using the number provided. We will help make sure you are managing your pain in the best way possible, and if necessary, we can provide a prescription for additional pain medication.   Day 1    Time  Name of Medication Number of pills taken  Amount of Acetaminophen  Pain Level   Comments  AM PM       AM PM       AM PM       AM PM       AM PM       AM PM       AM PM       AM PM       Total Daily amount of Acetaminophen Do not take more than  3,000 mg per day      Day 2    Time  Name of Medication Number of pills taken  Amount of Acetaminophen  Pain Level   Comments  AM PM       AM PM       AM PM       AM PM       AM PM       AM PM       AM PM       AM PM       Total Daily amount of Acetaminophen Do not take more than  3,000 mg per day      Day 3    Time  Name of Medication Number of pills taken  Amount of Acetaminophen  Pain Level   Comments  AM PM       AM PM       AM PM       AM PM          AM PM       AM PM       AM PM       AM PM       Total Daily amount of Acetaminophen Do not take more than  3,000 mg per day      Day 4    Time  Name of Medication Number of pills taken  Amount of Acetaminophen  Pain Level   Comments  AM PM       AM PM       AM PM       AM PM       AM PM       AM PM       AM PM       AM PM       Total Daily amount of Acetaminophen  Do not take more than  3,000 mg per day      Day 5    Time  Name of Medication Number of pills taken  Amount of Acetaminophen  Pain Level   Comments  AM PM       AM PM       AM PM       AM PM       AM PM       AM PM       AM PM       AM PM       Total Daily amount of Acetaminophen Do not take more than  3,000 mg per day       Day 6     Time  Name of Medication Number of pills taken  Amount of Acetaminophen  Pain Level  Comments  AM PM       AM PM       AM PM       AM PM       AM PM       AM PM       AM PM       AM PM       Total Daily amount of Acetaminophen Do not take more than  3,000 mg per day      Day 7    Time  Name of Medication Number of pills taken  Amount of Acetaminophen  Pain Level   Comments  AM PM       AM PM       AM PM       AM PM       AM PM       AM PM       AM PM       AM PM       Total Daily amount of Acetaminophen Do not take more than  3,000 mg per day        For additional information about how and where to safely dispose of unused opioid medications - [email or web address]  Disclaimer: This document contains information and/or instructional materials adapted from [facility] for the typical patient with your condition. It does not replace medical advice from your health care provider because your experience may differ from that of the typical patient. Talk to your health care provider if you have any questions about this document, your condition or your treatment plan. Adapted from [facility]  [facility] LAPAROSCOPIC SURGERY: POST OP INSTRUCTIONS Always review your discharge instruction sheet given to you by the facility where your surgery was performed. IF YOU HAVE DISABILITY OR FAMILY LEAVE FORMS, YOU MUST BRING THEM TO THE OFFICE FOR PROCESSING.   DO NOT GIVE THEM TO YOUR DOCTOR.  PAIN CONTROL  1. First take acetaminophen (Tylenol) AND/or ibuprofen (Advil) to control your pain after surgery.  Follow directions on package.  Taking acetaminophen (Tylenol) and/or ibuprofen (Advil) regularly after surgery will help to control your pain and lower the amount of prescription pain medication you may need.  You should not take more than 3,000 mg (3 grams) of acetaminophen (Tylenol) in 24 hours.  You should not take ibuprofen  (Advil), aleve, motrin, naprosyn or other NSAIDS if you have a history of stomach ulcers or chronic kidney disease.  2. A prescription for pain medication may be given to you upon discharge.  Take your pain medication as prescribed, if you still have uncontrolled  pain after taking acetaminophen (Tylenol) or ibuprofen (Advil). 3. Use ice packs to help control pain. 4. If you need a refill on your pain medication, please contact your pharmacy.  They will contact our office to request authorization. Prescriptions will not be filled after 5pm or on week-ends.  HOME MEDICATIONS 5. Take your usually prescribed medications unless otherwise directed.  DIET 6. You should follow a light diet the first few days after arrival home.  Be sure to include lots of fluids daily. Avoid fatty, fried foods.   CONSTIPATION 7. It is common to experience some constipation after surgery and if you are taking pain medication.  Increasing fluid intake and taking a stool softener (such as Colace) will usually help or prevent this problem from occurring.  A mild laxative (Milk of Magnesia or Miralax) should be taken according to package instructions if there are no bowel movements after 48 hours.  WOUND/INCISION CARE 8. Most patients will experience some swelling and bruising in the area of the incisions.  Ice packs will help.  Swelling and bruising can take several days to resolve.  9. Unless discharge instructions indicate otherwise, follow guidelines below  a. STERI-STRIPS - you may remove your outer bandages 48 hours after surgery, and you may shower at that time.  You have steri-strips (small skin tapes) in place directly over the incision.  These strips should be left on the skin for 7-10 days.   b. DERMABOND/SKIN GLUE - you may shower in 24 hours.  The glue will flake off over the next 2-3 weeks. 10. Any sutures or staples will be removed at the office during your follow-up visit.  ACTIVITIES 11. You may resume  regular (light) daily activities beginning the next day--such as daily self-care, walking, climbing stairs--gradually increasing activities as tolerated.  You may have sexual intercourse when it is comfortable.  Refrain from any heavy lifting or straining until approved by your doctor. a. You may drive when you are no longer taking prescription pain medication, you can comfortably wear a seatbelt, and you can safely maneuver your car and apply brakes.  FOLLOW-UP 12. You should see your doctor in the office for a follow-up appointment approximately 2-3 weeks after your surgery.  You should have been given your post-op/follow-up appointment when your surgery was scheduled.  If you did not receive a post-op/follow-up appointment, make sure that you call for this appointment within a day or two after you arrive home to insure a convenient appointment time.  WHEN TO CALL YOUR DOCTOR: 1. Fever over 101.0 2. Inability to urinate 3. Continued bleeding from incision. 4. Increased pain, redness, or drainage from the incision. 5. Increasing abdominal pain  The clinic staff is available to answer your questions during regular business hours.  Please dont hesitate to call and ask to speak to one of the nurses for clinical concerns.  If you have a medical emergency, go to the nearest emergency room or call 911.  A surgeon from [facility] is always on call at the hospital. [address], [address], [place], [place]  [address] ? [address] [address], [place], [place]   [address] [phone] ? [phone] ? FAX [phone] Web site: [email or web address]

## 2018-08-21 NOTE — Discharge Summary (Signed)
Amber Byrd IFB:[id number] DOB: Nov 09, 1990 DOA: 08/17/2018  PCP: Richardean Chimera, MD  Admit date: 08/17/2018  Discharge date: 08/21/2018  Admitted From: Home   Disposition:  Home   Recommendations for Outpatient Follow-up:   Follow up with PCP in 1-2 weeks  PCP Please obtain BMP/CBC, 2 view CXR in 1week,  (see Discharge instructions)   PCP Please follow up on the following pending results: Check CBC, CMP, TSH, free T4, T3 and H. pylori antigen in stool.  Needs outpatient GI and general surgery follow-up in 1 to 2 weeks.  Home Health: None   Equipment/Devices: None  Consultations: CS, GI Discharge Condition: Stable   CODE STATUS: Full   Diet Recommendation: Heart Healthy    CC - Abd pain   Brief history of present illness from the day of admission and additional interim summary    28 year old female otherwise healthy with history of mild depression admitted for epigastric abdominal pain initially to [facility] work-up there showed CBD dilation on ultrasound with CBD measuring 13 mm, she had a suspicious 6 mm CBD calculus, she was subsequently transferred here for further care.                                                                 Hospital Course    1.  Choledocholithiasis with gallstone pancreatitis - GI along with general surgery on board, being treated conservatively with bowel rest and IV fluids along with pain control, clinically stable, MRCP confirms CBD stone and obstruction.  ERCP with sphincterotomy and stone extraction by Dr. Marina Goodell on 08/19/2018, sequently underwent laparoscopic cholecystectomy by general surgery on 08/20/2018, has tolerated the procedure well, completely pain-free tolerating soft diet, LFTs trending down, will be discharged home with outpatient GI and general surgery  follow-up.  PCP to repeat CBC CMP within a week.  Please also ensure that patient follows with GI and general surgery in a week to 2 weeks post discharge.   2.  Mildly elevated TSH.  Could be sick euthyroid, outpatient recheck TSH in 4 to 6 weeks by PCP.  3.  Hypomagnesemia.  Replaced & stable.  4.  Positive H. pylori IgG.  At minimum indicates past exposure, outpatient GI follow-up in a week.  For now PPI.   Discharge diagnosis     Active Problems:   Elevated LFTs   Pancreatitis   Biliary obstruction   Hypokalemia   Abnormal MRI of the abdomen   Common bile duct stone    Discharge instructions    Discharge Instructions    Diet - low sodium heart healthy   Complete by:  As directed    Discharge instructions   Complete by:  As directed    Follow with Primary MD Richardean Chimera, MD in 7 days   Get  CBC, CMP checked  by Primary MD in 5-7 days    Activity: As tolerated with Full fall precautions use walker/cane & assistance as needed  Disposition Home   Diet: Heart Healthy   Special Instructions: If you have smoked or chewed Tobacco  in the last 2 yrs please stop smoking, stop any regular Alcohol  and or any Recreational drug use.  On your next visit with your primary care physician please Get Medicines reviewed and adjusted.  Please request your Prim.MD to go over all Hospital Tests and Procedure/Radiological results at the follow up, please get all Hospital records sent to your Prim MD by signing hospital release before you go home.  If you experience worsening of your admission symptoms, develop shortness of breath, life threatening emergency, suicidal or homicidal thoughts you must seek medical attention immediately by calling 911 or calling your MD immediately  if symptoms less severe.  You Must read complete instructions/literature along with all the possible adverse reactions/side effects for all the Medicines you take and that have been prescribed to you. Take any  new Medicines after you have completely understood and accpet all the possible adverse reactions/side effects.   Increase activity slowly   Complete by:  As directed       Discharge Medications   Allergies as of 08/21/2018   No Known Allergies     Medication List    STOP taking these medications   amoxicillin 875 MG tablet Commonly known as:  AMOXIL   metoCLOPramide 10 MG tablet Commonly known as:  REGLAN     TAKE these medications   acetaminophen 500 MG tablet Commonly known as:  TYLENOL Take 1 tablet (500 mg total) by mouth every 8 (eight) hours as needed for moderate pain.   naproxen sodium 550 MG tablet Commonly known as:  Anaprox DS Take 1 tablet (550 mg total) by mouth 2 (two) times daily between meals as needed for moderate pain. What changed:    when to take this  reasons to take this   Olopatadine HCl 0.2 % Soln Place 1 drop into both eyes every morning.   pantoprazole 40 MG tablet Commonly known as:  PROTONIX Take 1 tablet (40 mg total) by mouth daily.   sertraline 50 MG tablet Commonly known as:  ZOLOFT Take 1 tablet (50 mg total) by mouth daily. What changed:  how much to take   Vitamin D (Ergocalciferol) 1.25 MG (50000 UT) Caps capsule Commonly known as:  DRISDOL Take 1 capsule (50,000 Units total) by mouth every 7 (seven) days.       Follow-up Information    [facility] Go on 09/04/2018.   Specialty:  General Surgery Why:  Follow up appointment scheduled for 9:15 AM. Please arrive 30 min prior to appointment time. Bring photo ID and insurance information.  Contact information: [address] [place] [address] [phone]        [name], [name], MD. Schedule an appointment as soon as possible for a visit in 1 week(s).   Specialty:  Family Medicine Contact information: [address] [place] [address] [phone]        Hilarie FredricksonPerry, John N, MD. Schedule an appointment as soon as possible for a visit in 1  week(s).   Specialty:  Gastroenterology Why:  Positive H. pylori IgG Contact information: [address] [place] [address] [phone]           Major procedures and Radiology Reports - PLEASE  review detailed and final reports thoroughly  -     ERCP  Lap Choly  Mr Abdomen Mrcp Wo Contrast  Result Date: 08/18/2018 CLINICAL DATA:  Jaundice.  Abdominal pain.  History of pancreatitis. EXAM: MRI ABDOMEN WITHOUT CONTRAST  (INCLUDING MRCP) TECHNIQUE: Multiplanar multisequence MR imaging of the abdomen was performed. Heavily T2-weighted images of the biliary and pancreatic ducts were obtained, and three-dimensional MRCP images were rendered by post processing. COMPARISON:  Ultrasound of 08/17/2018.  Prior CT of 10/06/2014 FINDINGS: Portions of exam are mildly motion degraded. Lower chest: Normal heart size without pericardial or pleural effusion. Hepatobiliary: No focal liver lesion. Moderate to marked intrahepatic biliary duct dilatation. The common duct is moderately dilated for age, including at 14 mm on image 23/11. There is at least 1 distal common duct stone, best evaluated on image 2/16. This measures on the order of 4-5 mm. There is likely a second immediately proximal 2 mm stone. See also image 28/4. Pancreas: The pancreatic tail and upstream body are absent, likely developmental when compared to the 2016 exam. There is peripancreatic edema surrounding the head and neck, including on image 17/8. Fluid within the anterior pararenal space including on image 19/8. Spleen:  Normal in size, without focal abnormality. Adrenals/Urinary Tract: Normal adrenal glands. Normal kidneys, without hydronephrosis. Stomach/Bowel: Normal stomach and abdominal bowel loops. Vascular/Lymphatic: Normal aortic caliber. No abdominal adenopathy. Other:  Trace perihepatic ascites Musculoskeletal: No acute osseous abnormality. IMPRESSION: 1. Intra and extrahepatic biliary duct dilatation secondary to distal common  duct stone or stones as detailed above. 2. Non complicated pancreatitis. 3. Cholelithiasis. 4. Trace perihepatic ascites. Electronically Signed   By: Jeronimo Greaves M.D.   On: 08/18/2018 11:16   Dg Ercp Biliary & Pancreatic Ducts  Result Date: 08/19/2018 CLINICAL DATA:  ERCP. EXAM: ERCP TECHNIQUE: Multiple spot images obtained with the fluoroscopic device and submitted for interpretation post-procedure. COMPARISON:  MRCP-08/18/2018 FLUOROSCOPY TIME:  3 minutes, 43 seconds FINDINGS: 10 spot intraoperative fluoroscopic images of the right upper abdominal quadrant during ERCP are provided for review Initial image demonstrates an ERCP probe overlying the right upper abdominal quadrant. Subsequent images demonstrate selective cannulation and opacification of distal aspect of the CBD which appears mild to moderately dilated. Subsequent images demonstrate insufflation of a balloon within the central aspect the CBD with subsequent biliary sweeping and presumed sphincterotomy. There is minimal opacification intrahepatic biliary tree which appears nondilated. There is minimal opacification of the central aspect of the cystic duct. IMPRESSION: ERCP with biliary sweeping and presumed sphincterotomy as above. These images were submitted for radiologic interpretation only. Please see the procedural report for the amount of contrast and the fluoroscopy time utilized. Electronically Signed   By: Simonne Come M.D.   On: 08/19/2018 14:33    Micro Results     No results found for this or any previous visit (from the past 240 hour(s)).  Today   Subjective    Amber Byrd today has no headache,no chest abdominal pain,no new weakness tingling or numbness, feels much better wants to go home today.    Objective   Blood pressure 138/85, pulse 61, temperature 98.5 F (36.9 C), temperature source Oral, resp. rate 16, height  (1.6 m), weight 80 kg, last menstrual period 08/06/2018, SpO2 99 %.   Intake/Output Summary  (Last 24 hours) at 08/21/2018 0856 Last data filed at 08/20/2018 1800 Gross per 24 hour  Intake 1240 ml  Output 20 ml  Net 1220 ml    Exam  Awake  Alert, Oriented x 3, No new F.N deficits, Normal affect Gray.AT,PERRAL Supple Neck,No JVD, No cervical lymphadenopathy appriciated.  Symmetrical Chest wall movement, Good air movement bilaterally, CTAB RRR,No Gallops,Rubs or new Murmurs, No Parasternal Heave +ve B.Sounds, Abd Soft, Non tender, No organomegaly appriciated, No rebound -guarding or rigidity. No Cyanosis, Clubbing or edema, No new Rash or bruise   Data Review   CBC w Diff:  Lab Results  Component Value Date   WBC 8.1 08/21/2018   HGB 11.1 (L) 08/21/2018   HCT 33.1 (L) 08/21/2018   PLT 229 08/21/2018   LYMPHOPCT 27 08/17/2018   MONOPCT 9 08/17/2018   EOSPCT 1 08/17/2018   BASOPCT 1 08/17/2018    CMP:  Lab Results  Component Value Date   NA 138 08/21/2018   NA 140 08/12/2013   K 4.0 08/21/2018   CL 104 08/21/2018   CO2 25 08/21/2018   BUN 5 (L) 08/21/2018   BUN 8 08/12/2013   CREATININE 0.63 08/21/2018   PROT 6.3 (L) 08/21/2018   PROT 7.4 08/12/2013   ALBUMIN 3.1 (L) 08/21/2018   ALBUMIN 4.6 08/12/2013   BILITOT 2.6 (H) 08/21/2018   ALKPHOS 276 (H) 08/21/2018   AST 258 (H) 08/21/2018   ALT 377 (H) 08/21/2018  .   Total Time in preparing paper work, data evaluation and todays exam - 35 minutes  Susa Raring M.D on 08/21/2018 at 8:56 AM  [facility]   Office  [phone]

## 2018-08-21 NOTE — Anesthesia Postprocedure Evaluation (Signed)
Anesthesia Post Note  Patient: Amber Byrd  Procedure(s) Performed: LAPAROSCOPIC CHOLECYSTECTOMY (N/A )     Patient location during evaluation: PACU Anesthesia Type: General Level of consciousness: awake and alert Pain management: pain level controlled Vital Signs Assessment: post-procedure vital signs reviewed and stable Respiratory status: spontaneous breathing, nonlabored ventilation, respiratory function stable and patient connected to nasal cannula oxygen Cardiovascular status: blood pressure returned to baseline and stable Postop Assessment: no apparent nausea or vomiting Anesthetic complications: no    Last Vitals:  Vitals:   08/20/18 2157 08/21/18 0403  BP: 129/79 138/85  Pulse: 63 61  Resp: 16 16  Temp: 36.4 C 36.9 C  SpO2: 100% 99%    Last Pain:  Vitals:   08/21/18 0800  TempSrc:   PainSc: 0-No pain                 Carrington Olazabal

## 2018-08-21 NOTE — Plan of Care (Signed)
Patient is making progress toward discharge, alert and oriented x 4, vital signs stable, no acute distress noted.

## 2018-08-21 NOTE — Progress Notes (Signed)
1 Day Post-Op  Subjective: CC: Abdominal Pain Patient excited she is being discharged today. Minimal abdominal pain. No N/V. Tolerating diet. Mobilizing.   Objective: Vital signs in last 24 hours: Temp:  [97.2 F (36.2 C)-98.5 F (36.9 C)] 98.5 F (36.9 C) (03/10 0403) Pulse Rate:  [61-99] 61 (03/10 0403) Resp:  [14-19] 16 (03/10 0403) BP: (112-146)/(79-94) 138/85 (03/10 0403) SpO2:  [90 %-100 %] 99 % (03/10 0403) Last BM Date: 08/19/18  Intake/Output from previous day: 03/09 0701 - 03/10 0700 In: 1240 [P.O.:240; I.V.:1000] Out: 20 [Blood:20] Intake/Output this shift: No intake/output data recorded.  PE: Gen: Awake and alert, NAD Heart: RRR Lungs: CTA b/l Abd: Soft, ND, appropriately tender. Incisions with dermbond overlying, c/d/i. +BS Msk: No edema  Lab Results:  Recent Labs    08/19/18 0744 08/21/18 0321  WBC 4.1 8.1  HGB 12.1 11.1*  HCT 36.3 33.1*  PLT 201 229   BMET Recent Labs    08/20/18 0400 08/21/18 0321  NA 136 138  K 4.1 4.0  CL 104 104  CO2 23 25  GLUCOSE 104* 120*  BUN <5* 5*  CREATININE 0.67 0.63  CALCIUM 9.1 9.1   PT/INR Recent Labs    08/19/18 0744  LABPROT 13.3  INR 1.0   CMP     Component Value Date/Time   NA 138 08/21/2018 0321   NA 140 08/12/2013 1025   K 4.0 08/21/2018 0321   CL 104 08/21/2018 0321   CO2 25 08/21/2018 0321   GLUCOSE 120 (H) 08/21/2018 0321   BUN 5 (L) 08/21/2018 0321   BUN 8 08/12/2013 1025   CREATININE 0.63 08/21/2018 0321   CALCIUM 9.1 08/21/2018 0321   PROT 6.3 (L) 08/21/2018 0321   PROT 7.4 08/12/2013 1025   ALBUMIN 3.1 (L) 08/21/2018 0321   ALBUMIN 4.6 08/12/2013 1025   AST 258 (H) 08/21/2018 0321   ALT 377 (H) 08/21/2018 0321   ALKPHOS 276 (H) 08/21/2018 0321   BILITOT 2.6 (H) 08/21/2018 0321   GFRNONAA >60 08/21/2018 0321   GFRAA >60 08/21/2018 0321   Lipase     Component Value Date/Time   LIPASE 820 (H) 08/18/2018 0327       Studies/Results: Dg Ercp Biliary & Pancreatic  Ducts  Result Date: 08/19/2018 CLINICAL DATA:  ERCP. EXAM: ERCP TECHNIQUE: Multiple spot images obtained with the fluoroscopic device and submitted for interpretation post-procedure. COMPARISON:  MRCP-08/18/2018 FLUOROSCOPY TIME:  3 minutes, 43 seconds FINDINGS: 10 spot intraoperative fluoroscopic images of the right upper abdominal quadrant during ERCP are provided for review Initial image demonstrates an ERCP probe overlying the right upper abdominal quadrant. Subsequent images demonstrate selective cannulation and opacification of distal aspect of the CBD which appears mild to moderately dilated. Subsequent images demonstrate insufflation of a balloon within the central aspect the CBD with subsequent biliary sweeping and presumed sphincterotomy. There is minimal opacification intrahepatic biliary tree which appears nondilated. There is minimal opacification of the central aspect of the cystic duct. IMPRESSION: ERCP with biliary sweeping and presumed sphincterotomy as above. These images were submitted for radiologic interpretation only. Please see the procedural report for the amount of contrast and the fluoroscopy time utilized. Electronically Signed   By: Simonne Come M.D.   On: 08/19/2018 14:33    Anti-infectives: Anti-infectives (From admission, onward)   Start     Dose/Rate Route Frequency Ordered Stop   08/20/18 1030  ceFAZolin (ANCEF) IVPB 2g/100 mL premix     2 g 200 mL/hr over  30 Minutes Intravenous On call to O.R. 08/20/18 1022 08/20/18 1210   08/19/18 0930  ciprofloxacin (CIPRO) IVPB 400 mg  Status:  Discontinued     400 mg 200 mL/hr over 60 Minutes Intravenous  Once 08/19/18 8185 08/19/18 1151       Assessment/Plan  Active Problems:   Elevated LFTs   Pancreatitis   Biliary obstruction   Hypokalemia  Gallstone pancreatitis - S/p ERCP by Dr. Marina Goodell on 3/8 - S/p Lap Chole by Dr. Harriette Bouillon, 3/9 - On POD 1, the patient was voiding well, tolerating diet, ambulating well, pain  well controlled with oral medication, vital signs stable, incisions c/d/i. Appropriate follow up has been arranged. Patient to be discharged by hospitalist today. Okay for d/c from surgery standpoint.    LOS: 4 days    Jacinto Halim , [facility] 08/21/2018, 11:09 AM Pager: [phone]

## 2019-11-21 ENCOUNTER — Ambulatory Visit: Payer: Medicaid Other | Admitting: Allergy & Immunology

## 2020-01-24 ENCOUNTER — Ambulatory Visit: Payer: Medicaid Other | Admitting: Allergy

## 2020-05-22 IMAGING — MR MR MRCP
9 of 13 series · 37 of 48 positions shown · non-contrast
Comparison: Ultrasound of 08/17/2018.  Prior CT of 10/06/2014

CLINICAL DATA: Jaundice.  Abdominal pain.  History of pancreatitis.

EXAM:
MRI ABDOMEN WITHOUT CONTRAST  (INCLUDING MRCP)
TECHNIQUE: Multiplanar multisequence MR imaging of the abdomen was performed.
Heavily T2-weighted images of the biliary and pancreatic ducts were
obtained, and three-dimensional MRCP images were rendered by post
processing.

[Series 4: ax haste · axial · 6.0mm · 1.19mm/px · z∈[-107,+131]mm · 2 of 34 slices shown]
[im 1/34]
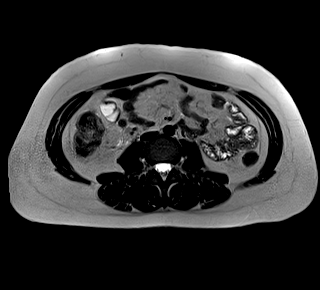
[im 34/34]
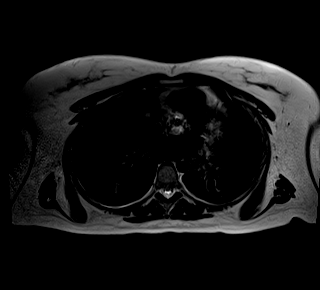

[Series 5: bSSFP · coronal · 6.0mm · 0.74mm/px · 2 of 30 slices shown]
[im 1/30]
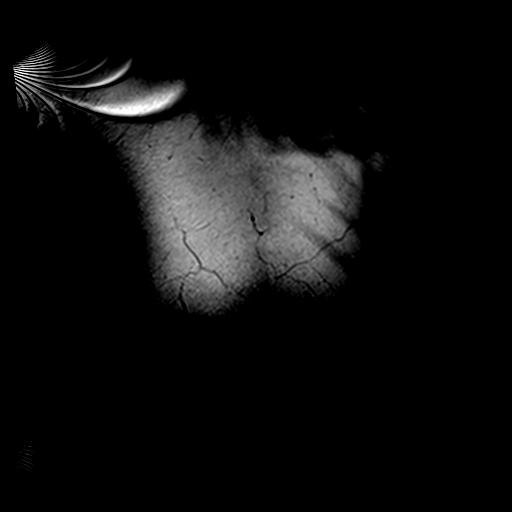
[im 30/30]
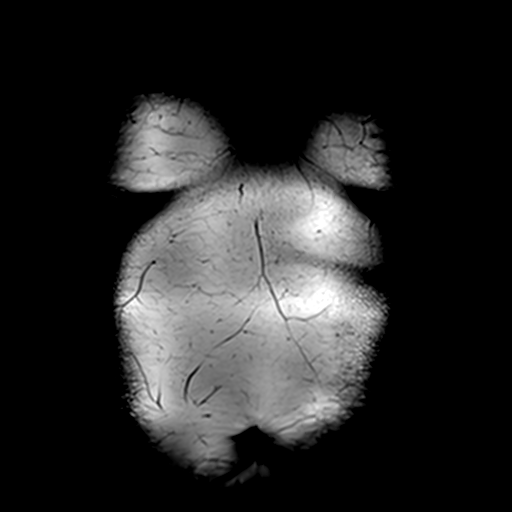

[Series 8: T2 fat-sat · axial · 6.0mm · 1.19mm/px · z∈[-116,+122]mm · 2 of 34 slices shown]
[im 1/34]
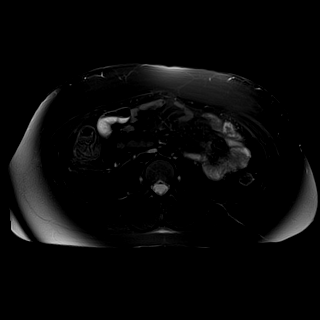
[im 34/34]
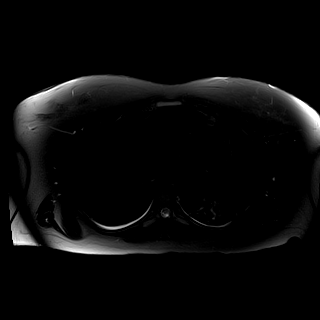

[Series 13: DWI · axial · 6.0mm · 1.42mm/px · z∈[-85,+123]mm · 8 of 90 slices shown (1 of 2)]
[im 1/90]
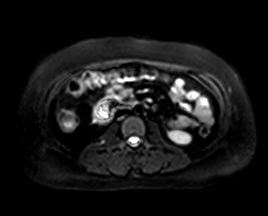
[im 13/90]
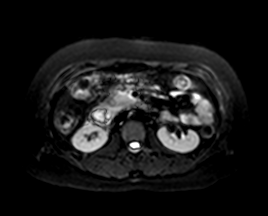
[im 26/90]
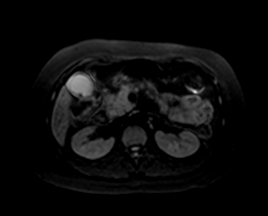
[im 39/90]
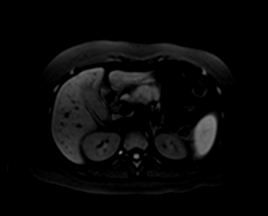
[im 51/90]
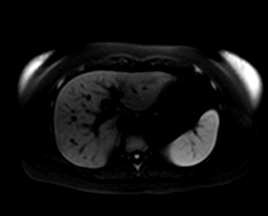
[im 64/90]
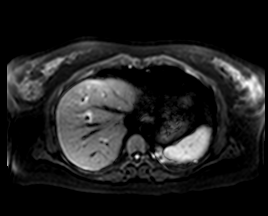
[im 77/90]
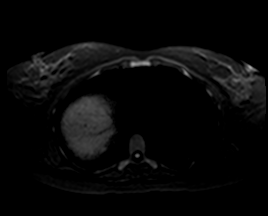
[im 90/90]
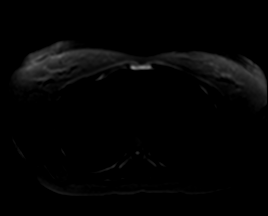

[Series 14: DWI · axial · 6.0mm · 1.42mm/px · z∈[-85,+123]mm · 3 of 30 slices shown (2 of 2)]
[im 1/30]
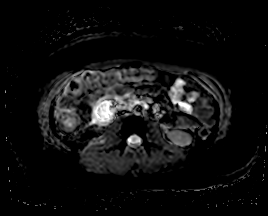
[im 15/30]
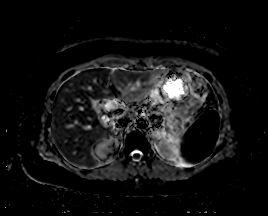
[im 30/30]
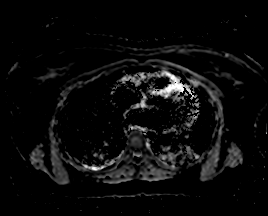

[Series 15: ax in and · axial · 3.0mm · 1.19mm/px · z∈[-89,+124]mm · 12 of 144 slices shown]
[im 1/144]
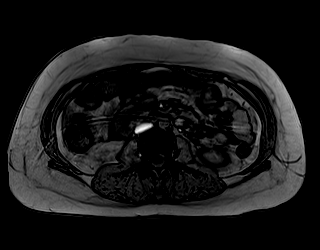
[im 14/144]
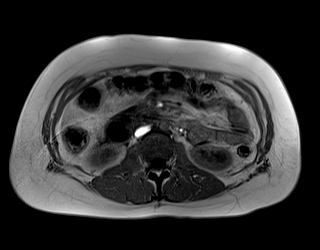
[im 27/144]
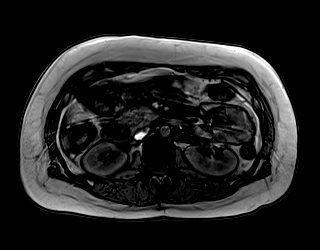
[im 40/144]
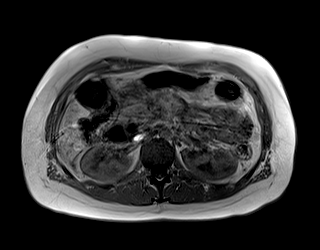
[im 53/144]
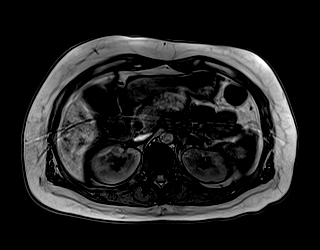
[im 66/144]
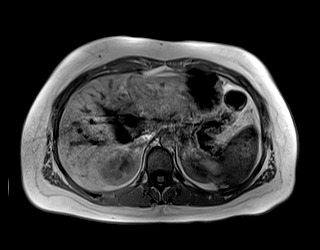
[im 79/144]
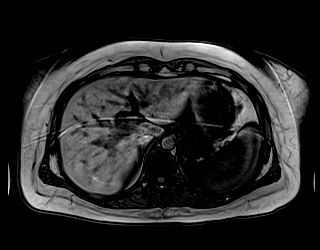
[im 92/144]
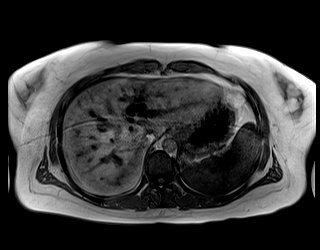
[im 105/144]
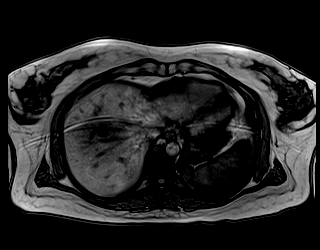
[im 118/144]
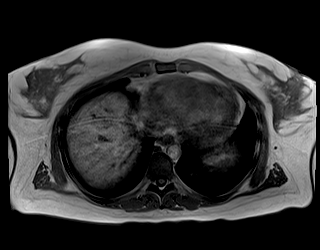
[im 131/144]
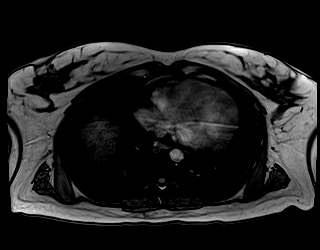
[im 144/144]
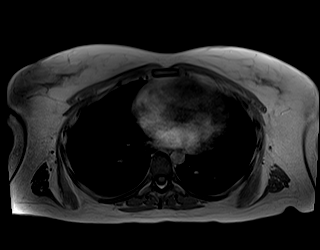

[Series 16: MRCP · coronal · 4.0mm · 1.12mm/px · 1 of 15 slices shown]
[im 1/15]
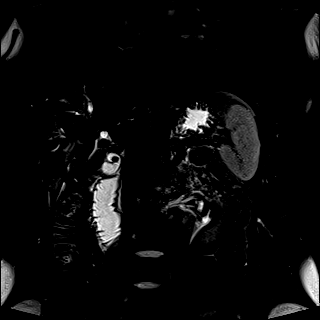

[Series 17: radials · coronal · 50.0mm · 0.78mm/px · 1 of 5 slices shown]
[im 1/5]
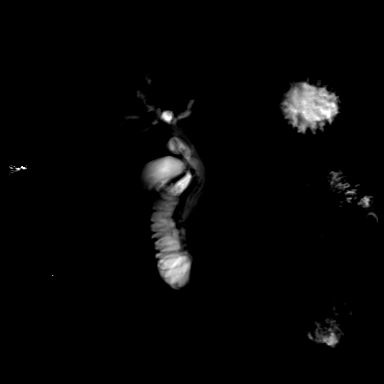

[Series 18: T1 dynamic · axial · non-contrast · 3.0mm · 1.19mm/px · z∈[-88,+125]mm · 6 of 72 slices shown]
[im 1/72]
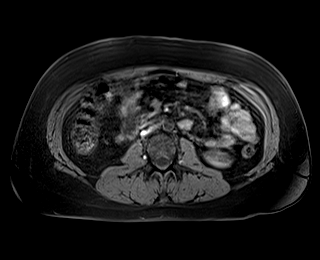
[im 15/72]
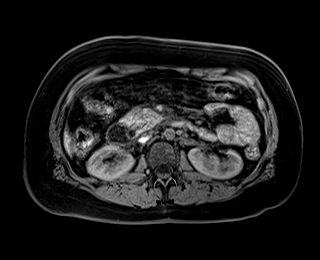
[im 29/72]
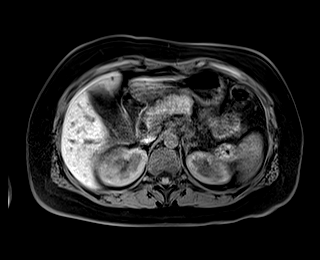
[im 43/72]
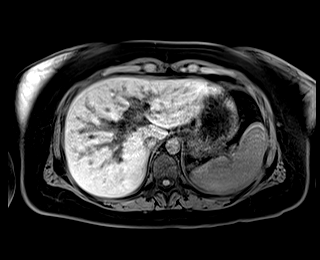
[im 57/72]
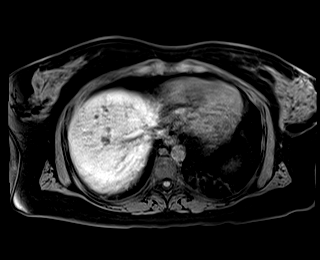
[im 72/72]
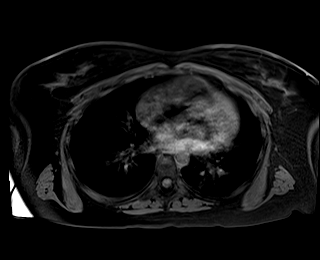

[37 of 48 positions shown; findings below may reference images not displayed]

FINDINGS: Portions of exam are mildly motion degraded.

Lower chest: Normal heart size without pericardial or pleural
effusion.

Hepatobiliary: No focal liver lesion. Moderate to marked
intrahepatic biliary duct dilatation. The common duct is moderately
dilated for age, including at 14 mm on image [DATE]. There is at
least 1 distal common duct stone, best evaluated on image [DATE]. This
measures on the order of 4-5 mm. There is likely a second
immediately proximal 2 mm stone. See also image [DATE].

Pancreas: The pancreatic tail and upstream body are absent, likely
developmental when compared to the 1044 exam. There is
peripancreatic edema surrounding the head and neck, including on
image [DATE]. Fluid within the anterior pararenal space including on
image [DATE].

Spleen:  Normal in size, without focal abnormality.

Adrenals/Urinary Tract: Normal adrenal glands. Normal kidneys,
without hydronephrosis.

Stomach/Bowel: Normal stomach and abdominal bowel loops.

Vascular/Lymphatic: Normal aortic caliber. No abdominal adenopathy.

Other:  Trace perihepatic ascites

Musculoskeletal: No acute osseous abnormality.
IMPRESSION: 1. Intra and extrahepatic biliary duct dilatation secondary to
distal common duct stone or stones as detailed above.
2. Non complicated pancreatitis.
3. Cholelithiasis.
4. Trace perihepatic ascites.
